# Patient Record
Sex: Female | Born: 2000 | Race: Black or African American | Hispanic: No | Marital: Single | Smoking: Current every day smoker
Health system: Southern US, Community
[De-identification: ages and names within clinical notes are randomized; demographics above are authoritative.]

## PROBLEM LIST (undated history)

## (undated) ENCOUNTER — Ambulatory Visit: Admission: EM | Source: Home / Self Care

## (undated) DIAGNOSIS — M419 Scoliosis, unspecified: Secondary | ICD-10-CM

## (undated) HISTORY — DX: Scoliosis, unspecified: M41.9

---
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
## 2018-05-12 ENCOUNTER — Encounter: Payer: Self-pay | Admitting: Obstetrics & Gynecology

## 2018-05-12 ENCOUNTER — Ambulatory Visit (INDEPENDENT_AMBULATORY_CARE_PROVIDER_SITE_OTHER): Payer: Medicaid Other | Admitting: Obstetrics & Gynecology

## 2018-05-12 VITALS — BP 100/66 | HR 101 | Wt 143.5 lb

## 2018-05-12 DIAGNOSIS — Z30011 Encounter for initial prescription of contraceptive pills: Secondary | ICD-10-CM

## 2018-05-12 DIAGNOSIS — R102 Pelvic and perineal pain: Secondary | ICD-10-CM | POA: Diagnosis not present

## 2018-05-12 DIAGNOSIS — Z3202 Encounter for pregnancy test, result negative: Secondary | ICD-10-CM | POA: Diagnosis not present

## 2018-05-12 DIAGNOSIS — N390 Urinary tract infection, site not specified: Secondary | ICD-10-CM

## 2018-05-12 DIAGNOSIS — R300 Dysuria: Secondary | ICD-10-CM | POA: Diagnosis not present

## 2018-05-12 DIAGNOSIS — Z7251 High risk heterosexual behavior: Secondary | ICD-10-CM | POA: Diagnosis not present

## 2018-05-12 LAB — POCT URINE PREGNANCY: Preg Test, Ur: NEGATIVE

## 2018-05-12 MED ORDER — NORGESTREL-ETHINYL ESTRADIOL 0.3-30 MG-MCG PO TABS: 1.0000 | ORAL_TABLET | Freq: Every day | ORAL | 11 refills | Status: DC

## 2018-05-12 MED ORDER — CEFTRIAXONE SODIUM 250 MG IJ SOLR
250.0000 mg | Freq: Once | INTRAMUSCULAR | Status: AC
  Administered 2018-05-12: 250 mg via INTRAMUSCULAR

## 2018-05-12 NOTE — Progress Notes (Signed)
Pt presents to f/u Ureaplasma Urealyticum. Referred by [facility]. Pt c/o outer vulva irritation. Pt wants to discuss birth control.

## 2018-05-12 NOTE — Progress Notes (Signed)
Patient ID: Mallory Mcintyre, female   DOB: 06-05-2001, 17 y.o.   MRN: [id number]  Chief Complaint  Patient presents with  . Follow-up    HPI Mallory Mcintyre is a 17 y.o. female.  Single P0 here today with the issue of 8-9 month of cramping in her vagina. This comes and goes. She reports that she has tried IBU "more than once" and this did not help. Walking makes it worse. She gets menstrual cramps with her periods, but this pain is different. She reports that only once did this pain happen with sex.   She was prescribed doxy for a UTI of ureaplasma but she would throw it up.   She reports that she has some peeling skin on her vulva, present for a few months (maybe 7).  Nothing makes it better or worse. She tried wiping with a hot rag. She has not tried any creams/treatments.  Past Medical History:  Diagnosis Date  . Scoliosis     Past Surgical History:  Procedure Laterality Date  . SPINAL FUSION  08/12/2013    Family History  Problem Relation Age of Onset  . Diabetes Other   . Asthma Other   . Hypertension Other     Social History Social History   Tobacco Use  . Smoking status: Heavy Tobacco Smoker    Types: Cigars  . Smokeless tobacco: Never Used  . Tobacco comment: 2-3 Black n mild daily   Substance Use Topics  . Alcohol use: No  . Drug use: No    No Known Allergies  Current Outpatient Medications  Medication Sig Dispense Refill  . hydrocortisone 2.5 % lotion Use twice daily as needed for itching. Do not apply to face. (Patient not taking: Reported on 05/12/2018) 59 mL 0  . ranitidine (ZANTAC) 150 MG capsule Take 1 capsule (150 mg total) by mouth daily. (Patient not taking: Reported on 05/12/2018) 30 capsule 0   No current facility-administered medications for this visit.     Review of Systems Review of Systems She uses condoms "sometimes, but very rarely", says that she does not want a pregnancy. She has been monogamous since 1/19 She had STI testing at her other  doctor's office- negative  Blood pressure 100/66, pulse (!) 101, weight 143 lb 8 oz (65.1 kg), last menstrual period 05/06/2018.  Physical Exam Physical Exam Breathing, conversing, and ambulating normally Well nourished, well hydrated Black female, no apparent distress Her vulva and vaginal discharge are completely normal She has point tenderness when I press up against her urethra  Data Reviewed I reviewed her records from [facility] Her hbg is 11. She does think that she would remember to take a OCP daily She works at [employer], parttime Assessment    Contraception Vaginal pain Vulvar skin issues    Plan    Start lo ovral with NMP, rec condoms all the time    She will ask her mom later if she has had Gardasil. If not, then she will start the series when she comes back.   She will return when/if her vulvar skin condition returns.  I will give her a shot of rocephin today. If her vaginal pain (urethral pain) does not abate, then I will refer her to a urologist.  She declines a flu vaccine.  Come back 6 weeks  Mallory Mcintyre 05/12/2018, 2:02 PM

## 2018-05-14 LAB — URINE CULTURE

## 2018-06-23 ENCOUNTER — Ambulatory Visit (INDEPENDENT_AMBULATORY_CARE_PROVIDER_SITE_OTHER): Payer: Medicaid Other | Admitting: Obstetrics and Gynecology

## 2018-06-23 ENCOUNTER — Encounter: Payer: Self-pay | Admitting: Obstetrics and Gynecology

## 2018-06-23 ENCOUNTER — Other Ambulatory Visit (HOSPITAL_COMMUNITY)
Admission: RE | Admit: 2018-06-23 | Discharge: 2018-06-23 | Disposition: A | Discharge status: Home / Self Care | Payer: Medicaid Other | Source: Ambulatory Visit | Attending: Obstetrics and Gynecology | Admitting: Obstetrics and Gynecology

## 2018-06-23 VITALS — BP 135/84 | Wt 144.8 lb

## 2018-06-23 DIAGNOSIS — N761 Subacute and chronic vaginitis: Secondary | ICD-10-CM

## 2018-06-23 MED ORDER — NYSTATIN 100000 UNIT/GM EX CREA: 1.0000 "application " | TOPICAL_CREAM | Freq: Two times a day (BID) | CUTANEOUS | 1 refills | Status: DC

## 2018-06-23 NOTE — Progress Notes (Signed)
17 yo here for follow up on vaginal pain. Patient reports pain unchanged from previously and her skin continues to peel. She is taking COC for contraception. She reports some dysmenorrhea with her last period. She reports intermittent vaginal irritation. She reports intermittent urinary frequency. She is sexually active without complaints.  Past Medical History:  Diagnosis Date  . Scoliosis    Past Surgical History:  Procedure Laterality Date  . SPINAL FUSION  08/12/2013   Family History  Problem Relation Age of Onset  . Diabetes Other   . Asthma Other   . Hypertension Other    Social History   Tobacco Use  . Smoking status: Heavy Tobacco Smoker    Types: Cigars  . Smokeless tobacco: Never Used  . Tobacco comment: 2-3 Black n mild daily   Substance Use Topics  . Alcohol use: No  . Drug use: No   ROS See pertinent in HPI  Blood pressure 135/84, weight 144 lb 12.8 oz (65.7 kg), last menstrual period 06/05/2018.  GENERAL: Well-developed, well-nourished female in no acute distress.  ABDOMEN: Soft, nontender, nondistended. No organomegaly. PELVIC: Normal external female genitalia. Vagina is pink and rugated.  Normal discharge. Normal appearing cervix. Uterus is normal in size. No adnexal mass or tenderness. EXTREMITIES: No cyanosis, clubbing, or edema, 2+ distal pulses.  A/P 17 yo with vaginal pain - wet prep collected - urine culture collected - rx nystatin cream provided - patient will be contacted with abnormal results

## 2018-06-23 NOTE — Progress Notes (Signed)
Pt was here in 10/19 for vulvar pain/"peeling". Pt also had ureaplasma, was given rocephin injection- pt was also given birth control pills at that visit. Pt states she is still having the vulvar pain and also is still having to urinate quite often.

## 2018-06-24 LAB — CERVICOVAGINAL ANCILLARY ONLY
Bacterial vaginitis: NEGATIVE
CANDIDA VAGINITIS: POSITIVE — AB
Chlamydia: NEGATIVE
Neisseria Gonorrhea: NEGATIVE
TRICH (WINDOWPATH): NEGATIVE

## 2018-06-25 LAB — URINE CULTURE

## 2018-06-25 MED ORDER — FLUCONAZOLE 150 MG PO TABS: 150.0000 mg | ORAL_TABLET | Freq: Once | ORAL | 0 refills | Status: AC

## 2018-06-25 NOTE — Addendum Note (Signed)
Addended by: Catalina AntiguaONSTANT, Minnah Llamas on: 06/25/2018 08:04 AM   Modules accepted: Orders

## 2018-08-12 NOTE — L&D Delivery Note (Signed)
Patient is a 18 y.o. now G1P1 s/p NSVD at [redacted]w[redacted]d, who was admitted for PROM with SOL less than 24 hours prior to delivery.  She progressed without augmentation to complete and pushed 14 minutes to deliver.  Cord clamping delayed by several minutes then clamped by CNM and cut by FOB- Janique.  Placenta intact and spontaneous, bleeding minimal.  Right labial laceration repaired without difficulty.  Mom and baby stable prior to transfer to postpartum. She plans on breastfeeding. She is unsure of method for birth control.  Delivery Note At 8:39 AM a viable and healthy female was delivered via Vaginal, Spontaneous (Presentation: LOA).  APGAR: 9, 9; weight pending .   Placenta intact and spontaneous, bleeding minimal.  3VCord with no labor or delivery complications.  Anesthesia: IV Fentanyl   Episiotomy: None Lacerations: Labial Suture Repair: 3.0 vicryl Est. Blood Loss (mL): 141  Mom to postpartum.  Baby to Couplet care / Skin to Skin.  Lajean Manes CNM 05/12/2019, 9:20 AM

## 2018-09-24 ENCOUNTER — Encounter: Payer: Self-pay | Admitting: Obstetrics

## 2018-10-20 ENCOUNTER — Other Ambulatory Visit (HOSPITAL_COMMUNITY)
Admission: RE | Admit: 2018-10-20 | Discharge: 2018-10-20 | Disposition: A | Discharge status: Home / Self Care | Payer: Medicaid Other | Source: Ambulatory Visit

## 2018-10-20 ENCOUNTER — Ambulatory Visit (INDEPENDENT_AMBULATORY_CARE_PROVIDER_SITE_OTHER): Payer: Medicaid Other

## 2018-10-20 VITALS — BP 125/77 | HR 108 | Temp 98.6°F | Wt 129.2 lb

## 2018-10-20 DIAGNOSIS — Z3401 Encounter for supervision of normal first pregnancy, first trimester: Secondary | ICD-10-CM

## 2018-10-20 DIAGNOSIS — Z3A11 11 weeks gestation of pregnancy: Secondary | ICD-10-CM

## 2018-10-20 DIAGNOSIS — O98811 Other maternal infectious and parasitic diseases complicating pregnancy, first trimester: Secondary | ICD-10-CM

## 2018-10-20 DIAGNOSIS — Z3400 Encounter for supervision of normal first pregnancy, unspecified trimester: Secondary | ICD-10-CM | POA: Diagnosis not present

## 2018-10-20 DIAGNOSIS — O98819 Other maternal infectious and parasitic diseases complicating pregnancy, unspecified trimester: Secondary | ICD-10-CM

## 2018-10-20 DIAGNOSIS — F1290 Cannabis use, unspecified, uncomplicated: Secondary | ICD-10-CM

## 2018-10-20 DIAGNOSIS — Z3481 Encounter for supervision of other normal pregnancy, first trimester: Secondary | ICD-10-CM | POA: Diagnosis not present

## 2018-10-20 DIAGNOSIS — Z3490 Encounter for supervision of normal pregnancy, unspecified, unspecified trimester: Secondary | ICD-10-CM

## 2018-10-20 DIAGNOSIS — B373 Candidiasis of vulva and vagina: Secondary | ICD-10-CM

## 2018-10-20 HISTORY — DX: Encounter for supervision of normal pregnancy, unspecified, unspecified trimester: Z34.90

## 2018-10-20 MED ORDER — ONDANSETRON 4 MG PO TBDP: 4.0000 mg | ORAL_TABLET | Freq: Four times a day (QID) | ORAL | 0 refills | Status: DC | PRN

## 2018-10-20 NOTE — Patient Instructions (Addendum)
Prenatal Care Prenatal care is health care during pregnancy. It helps you and your unborn baby (fetus) stay as healthy as possible. Prenatal care may be provided by a midwife, a family practice health care provider, or a childbirth and pregnancy specialist (obstetrician). How does this affect me? During pregnancy, you will be closely monitored for any new conditions that might develop. To lower your risk of pregnancy complications, you and your health care provider will talk about any underlying conditions you have. How does this affect my baby? Early and consistent prenatal care increases the chance that your baby will be healthy during pregnancy. Prenatal care lowers the risk that your baby will be:  Born early (prematurely).  Smaller than expected at birth (small for gestational age). What can I expect at the first prenatal care visit? Your first prenatal care visit will likely be the longest. You should schedule your first prenatal care visit as soon as you know that you are pregnant. Your first visit is a good time to talk about any questions or concerns you have about pregnancy. At your visit, you and your health care provider will talk about:  Your medical history, including: ? Any past pregnancies. ? Your family's medical history. ? The baby's father's medical history. ? Any long-term (chronic) health conditions you have and how you manage them. ? Any surgeries or procedures you have had. ? Any current over-the-counter or prescription medicines, herbs, or supplements you are taking.  Other factors that could pose a risk to your baby, including:  Your home setting and your stress levels, including: ? Exposure to abuse or violence. ? Household financial strain. ? Mental health conditions you have.  Your daily health habits, including diet and exercise. Your health care provider will also:  Measure your weight, height, and blood pressure.  Do a physical exam, including a pelvic  and breast exam.  Perform blood tests and urine tests to check for: ? Urinary tract infection. ? Sexually transmitted infections (STIs). ? Low iron levels in your blood (anemia). ? Blood type and certain proteins on red blood cells (Rh antibodies). ? Infections and immunity to viruses, such as hepatitis B and rubella. ? HIV (human immunodeficiency virus).  Do an ultrasound to confirm your baby's growth and development and to help predict your estimated due date (EDD). This ultrasound is done with a probe that is inserted into the vagina (transvaginal ultrasound).  Discuss your options for genetic screening.  Give you information about how to keep yourself and your baby healthy, including: ? Nutrition and taking vitamins. ? Physical activity. ? How to manage pregnancy symptoms such as nausea and vomiting (morning sickness). ? Infections and substances that may be harmful to your baby and how to avoid them. ? Food safety. ? Dental care. ? Working. ? Travel. ? Warning signs to watch for and when to call your health care provider. How often will I have prenatal care visits? After your first prenatal care visit, you will have regular visits throughout your pregnancy. The visit schedule is often as follows:  Up to week 28 of pregnancy: once every 4 weeks.  28-36 weeks: once every 2 weeks.  After 36 weeks: every week until delivery. Some women may have visits more or less often depending on any underlying health conditions and the health of the baby. Keep all follow-up and prenatal care visits as told by your health care provider. This is important. What happens during routine prenatal care visits? Your health care provider will:    Measure your weight and blood pressure.  Check for fetal heart sounds.  Measure the height of your uterus in your abdomen (fundal height). This may be measured starting around week 20 of pregnancy.  Check the position of your baby inside your  uterus.  Ask questions about your diet, sleeping patterns, and whether you can feel the baby move.  Review warning signs to watch for and signs of labor.  Ask about any pregnancy symptoms you are having and how you are dealing with them. Symptoms may include: ? Headaches. ? Nausea and vomiting. ? Vaginal discharge. ? Swelling. ? Fatigue. ? Constipation. ? Any discomfort, including back or pelvic pain. Make a list of questions to ask your health care provider at your routine visits. What tests might I have during prenatal care visits? You may have blood, urine, and imaging tests throughout your pregnancy, such as:  Urine tests to check for glucose, protein, or signs of infection.  Glucose tests to check for a form of diabetes that can develop during pregnancy (gestational diabetes mellitus). This is usually done around week 24 of pregnancy.  An ultrasound to check your baby's growth and development and to check for birth defects. This is usually done around week 20 of pregnancy.  A test to check for group B strep (GBS) infection. This is usually done around week 36 of pregnancy.  Genetic testing. This may include blood or imaging tests, such as an ultrasound. Some genetic tests are done during the first trimester and some are done during the second trimester. What else can I expect during prenatal care visits? Your health care provider may recommend getting certain vaccines during pregnancy. These may include:  A yearly flu shot (annual influenza vaccine). This is especially important if you will be pregnant during flu season.  Tdap (tetanus, diphtheria, pertussis) vaccine. Getting this vaccine during pregnancy can protect your baby from whooping cough (pertussis) after birth. This vaccine may be recommended between weeks 27 and 36 of pregnancy. Later in your pregnancy, your health care provider may give you information about:  Childbirth and breastfeeding classes.  Choosing a  health care provider for your baby. U Pregnancy and Anemia  Anemia is a condition in which the concentration of red blood cells, or hemoglobin, in the blood is below normal. Hemoglobin is a substance in red blood cells that carries oxygen to the tissues of the body. Anemia results when enough oxygen does not reach these tissues. Anemia is common during pregnancy because the woman's body needs more blood volume and blood cells to provide nutrition to the fetus. The fetus needs iron and folic acid as it is developing. Your body may not produce enough red blood cells because of this. Also, during pregnancy, the liquid part of the blood (plasma) increases by about 30-50%, and the red blood cells increase by only 20%. This lowers the concentration of the red blood cells and creates a natural anemia-like situation. What are the causes? The most common cause of anemia during pregnancy is not having enough iron in the body to make red blood cells (iron deficiency anemia). Other causes may include: Folic acid deficiency. Vitamin B12 deficiency. Certain prescription or over-the-counter medicines. Certain medical conditions or infections that destroy red blood cells. A low platelet count and bleeding caused by antibodies that go through the placenta to the fetus from the mother's blood. What are the signs or symptoms? Mild anemia may not be noticeable. If it becomes severe, symptoms may include: Feeling tired (  fatigue). Shortness of breath, especially during activity. Weakness. Fainting. Pale looking skin. Headaches. A fast or irregular heartbeat (palpitations). Dizziness. How is this diagnosed? This condition may be diagnosed based on: Your medical history and a physical exam. Blood tests. How is this treated? Treatment for anemia during pregnancy depends on the cause of the anemia. Treatment can include: Dietary changes. Supplements of iron, vitamin B12, or folic acid. A blood transfusion. This  may be needed if anemia is severe. Hospitalization. This may be needed if there is a lot of blood loss or severe anemia. Follow these instructions at home: Follow recommendations from your dietitian or health care provider about changing your diet. Increase your vitamin C intake. This will help the stomach absorb more iron. Some foods that are high in vitamin C include: Oranges. Peppers. Tomatoes. Mangoes. Eat a diet rich in iron. This would include foods such as: Liver. Beef. Eggs. Whole grains. Spinach. Dried fruit. Take iron and vitamins as told by your health care provider. Eat green leafy vegetables. These are a good source of folic acid. Keep all follow-up visits as told by your health care provider. This is important. Contact a health care provider if: You have frequent or lasting headaches. You look pale. You bruise easily. Get help right away if: You have extreme weakness, shortness of breath, or chest pain. You become dizzy or have trouble concentrating. You have heavy vaginal bleeding. You develop a rash. You have bloody or black, tarry stools. You faint. You vomit up blood. You vomit repeatedly. You have abdominal pain. You have a fever. You are dehydrated. Summary Anemia is a condition in which the concentration of red blood cells or hemoglobin in the blood is below normal. Anemia is common during pregnancy because the woman's body needs more blood volume and blood cells to provide nutrition to the fetus. The most common cause of anemia during pregnancy is not having enough iron in the body to make red blood cells (iron deficiency anemia). Mild anemia may not be noticeable. If it becomes severe, symptoms may include feeling tired and weak. This information is not intended to replace advice given to you by your health care provider. Make sure you discuss any questions you have with your health care provider. Document Released: 07/26/2000 Document Revised:  09/03/2016 Document Reviewed: 09/03/2016 Elsevier Interactive Patient Education  2019 [employer].  [place] cord banking.  Breastfeeding.  Birth control after your baby is born.  The hospital labor and delivery unit and how to tour it.  Registering at the hospital before you go into labor. Where to find more information  Office on Women's Health: TravelLesson.ca  American Pregnancy Association: americanpregnancy.org  March of Dimes: marchofdimes.org Summary  Prenatal care helps you and your baby stay as healthy as possible during pregnancy.  Your first prenatal care visit will most likely be the longest.  You will have visits and tests throughout your pregnancy to monitor your health and your baby's health.  Bring a list of questions to your visits to ask your health care provider.  Make sure to keep all follow-up and prenatal care visits with your health care provider. This information is not intended to replace advice given to you by your health care provider. Make sure you discuss any questions you have with your health care provider. Document Released: 08/01/2003 Document Revised: 07/28/2017 Document Reviewed: 07/28/2017 Elsevier Interactive Patient Education  2019 [employer].

## 2018-10-20 NOTE — Progress Notes (Signed)
Subjective:   Mallory Mcintyre is a 17 y.o. G1P0000 at [redacted]w[redacted]d by [employer] being seen today for her first obstetrical visit.  Her obstetrical history is significant for smoker.  Patient states she has quit smoking cigars, but continues to smoke Marijuana.  She states the marijuana assists with the morning sickness and expresses a desire to quit, but feels it is necessary to eat. Patient reports a history of scoliosis. Patient does intend to breast feed. Pregnancy history fully reviewed.  Patient reports nausea.  HISTORY: OB History  Gravida Para Term Preterm AB Living  1 0 0 0 0 0  SAB TAB Ectopic Multiple Live Births  0 0 0 0 0    # Outcome Date GA Lbr Len/2nd Weight Sex Delivery Anes PTL Lv  1 Current             Last pap smear was deferred due to age  Past Medical History:  Diagnosis Date  . Scoliosis    Past Surgical History:  Procedure Laterality Date  . SPINAL FUSION  08/12/2013   Family History  Problem Relation Age of Onset  . Diabetes Other   . Asthma Other   . Hypertension Other    Social History   Tobacco Use  . Smoking status: Heavy Tobacco Smoker    Types: Cigars  . Smokeless tobacco: Never Used  . Tobacco comment: 2-3 Black n mild daily   Substance Use Topics  . Alcohol use: No  . Drug use: No   No Known Allergies Current Outpatient Medications on File Prior to Visit  Medication Sig Dispense Refill  . Prenatal MV-Min-FA-Omega-3 (PRENATAL GUMMIES/DHA & FA) 0.4-32.5 MG CHEW Chew by mouth.     No current facility-administered medications on file prior to visit.     Review of Systems Pertinent items noted in HPI and remainder of comprehensive ROS otherwise negative.  Exam   Vitals:   10/20/18 1427  BP: 125/77  Pulse: (!) 108  Temp: 98.6 F (37 C)  Weight: 129 lb 3.2 oz (58.6 kg)   Fetal Heart Rate (bpm): 174   Physical Exam  Constitutional: She is oriented to person, place, and time and well-developed, well-nourished, and in no  distress.  HENT:  Head: Normocephalic and atraumatic.  Eyes: Conjunctivae are normal.  Neck: Normal range of motion.  Cardiovascular: Normal rate, regular rhythm and normal heart sounds.  Pulmonary/Chest: Effort normal and breath sounds normal.  Abdominal: Soft. Bowel sounds are normal. There is no abdominal tenderness.  Musculoskeletal: Normal range of motion.        General: No edema.  Neurological: She is alert and oriented to person, place, and time.  Skin: Skin is warm and dry.  Psychiatric: Affect and judgment normal.    Assessment:   Pregnancy: G1P0000 Patient Active Problem List   Diagnosis Date Noted  . Encounter for supervision of normal pregnancy, antepartum 10/20/2018  . Scoliosis 11/26/2013     Plan:  1. Supervision of normal first pregnancy, antepartum -Discussed discontinuation of marijuana. -Rx for Zofran 4mg  ODT sent to pharmacy on file. *Instructed on proper administration and weaning of marijuana. -Anticipatory guidance for [facility] including appointments and testing. -Discussed option of baby scripts, Centering Pregnancy, and Routine PNC -Desires routine PNC -RTO in 4 weeks for regular PNV  *Culture, OB Urine *Obstetric Panel, Including HIV *Cervicovaginal ancillary only( [place]) *Genetic Screening   Initial labs drawn. Continue prenatal vitamins. Genetic Screening discussed, First trimester screen, Quad screen and NIPS: ordered.  Ultrasound discussed; fetal anatomic survey: Discussed Problem list reviewed and updated. The nature of [facility] with multiple MDs and other Advanced Practice Providers was explained to patient; also emphasized that residents, students are part of our team. Routine obstetric precautions reviewed. Return in about 4 weeks (around 11/17/2018) for ROB.    Cherre Robins, CNM 10/20/2018 6:30 PM

## 2018-10-21 DIAGNOSIS — F1290 Cannabis use, unspecified, uncomplicated: Secondary | ICD-10-CM | POA: Insufficient documentation

## 2018-10-21 LAB — OBSTETRIC PANEL, INCLUDING HIV
Antibody Screen: NEGATIVE
BASOS ABS: 0 10*3/uL (ref 0.0–0.2)
Basos: 0 %
EOS (ABSOLUTE): 0 10*3/uL (ref 0.0–0.4)
Eos: 0 %
HIV Screen 4th Generation wRfx: NONREACTIVE
Hematocrit: 33.9 % — ABNORMAL LOW (ref 34.0–46.6)
Hemoglobin: 11.3 g/dL (ref 11.1–15.9)
Hepatitis B Surface Ag: NEGATIVE
Immature Grans (Abs): 0.1 10*3/uL (ref 0.0–0.1)
Immature Granulocytes: 1 %
Lymphocytes Absolute: 2 10*3/uL (ref 0.7–3.1)
Lymphs: 22 %
MCH: 28.7 pg (ref 26.6–33.0)
MCHC: 33.3 g/dL (ref 31.5–35.7)
MCV: 86 fL (ref 79–97)
Monocytes Absolute: 0.7 10*3/uL (ref 0.1–0.9)
Monocytes: 7 %
NEUTROS ABS: 6.4 10*3/uL (ref 1.4–7.0)
Neutrophils: 70 %
PLATELETS: 355 10*3/uL (ref 150–450)
RBC: 3.94 x10E6/uL (ref 3.77–5.28)
RDW: 11.6 % — AB (ref 11.7–15.4)
RPR: NONREACTIVE
Rh Factor: POSITIVE
Rubella Antibodies, IGG: 2.55 index (ref >0.99)
WBC: 9.1 10*3/uL (ref 3.4–10.8)

## 2018-10-21 LAB — CERVICOVAGINAL ANCILLARY ONLY
Bacterial vaginitis: NEGATIVE
CANDIDA VAGINITIS: POSITIVE — AB
Chlamydia: NEGATIVE
Neisseria Gonorrhea: NEGATIVE
Trichomonas: NEGATIVE

## 2018-10-22 LAB — URINE CULTURE, OB REFLEX

## 2018-10-22 LAB — CULTURE, OB URINE

## 2018-10-22 MED ORDER — TERCONAZOLE 0.4 % VA CREA: 1.0000 | TOPICAL_CREAM | Freq: Every day | VAGINAL | 0 refills | Status: DC

## 2018-10-22 NOTE — Addendum Note (Signed)
Addended by: Gerrit Heck L on: 10/22/2018 05:22 PM   Modules accepted: Orders

## 2018-11-03 ENCOUNTER — Telehealth: Payer: Self-pay

## 2018-11-03 DIAGNOSIS — D563 Thalassemia minor: Secondary | ICD-10-CM | POA: Insufficient documentation

## 2018-11-03 DIAGNOSIS — Z3400 Encounter for supervision of normal first pregnancy, unspecified trimester: Secondary | ICD-10-CM

## 2018-11-03 MED ORDER — ONDANSETRON 4 MG PO TBDP: 4.0000 mg | ORAL_TABLET | Freq: Four times a day (QID) | ORAL | 0 refills | Status: DC | PRN

## 2018-11-03 NOTE — Telephone Encounter (Addendum)
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  Patient educated on alpha thalassemia, how it may manifest, and Horizon recommendation for partner screening.  Patient questions how partner is to be screened and instructed that partner could contact PCP.  Also informed that provider would find out if [employer] would provide a screening kit for partner.  Reassurances given that even if partner is a carrier, the risk of having a baby with the disorder is low to none per [employer].  Patient without further questions or concerns regarding results.  Patient does question if she can bring partner to upcoming visit and provider informed that visitors need to be limited in setting of Co-Vid 19 pandemic.  Provider also gave patient option for Telehealth visit and explained what this would consist of.  Informed that office staff would contact patient closer to visit with options and further information.  Patient questions if Rx for yeast infection was sent to pharmacy and provider confirms.  Patient also requests refill on Zofran which provider will send in for 53m ODT Q6hrs prn, Disp 30, RF0.  Patient questions when [place] be performed and educated accordingly.  No other questions or concerns.  Encouraged to call or report to MAU for any issues.  MFM Genetics Referral Placed  I provided ~15 minutes of non-face-to-face time during this encounter.   JMaryann ConnersMSN, CNM  11/03/2018 2:20 PM

## 2018-11-18 ENCOUNTER — Ambulatory Visit (HOSPITAL_COMMUNITY): Payer: Medicaid Other

## 2018-11-19 ENCOUNTER — Other Ambulatory Visit: Payer: Self-pay

## 2018-11-19 ENCOUNTER — Encounter: Payer: Self-pay | Admitting: Certified Nurse Midwife

## 2018-11-19 ENCOUNTER — Ambulatory Visit (INDEPENDENT_AMBULATORY_CARE_PROVIDER_SITE_OTHER): Payer: Medicaid Other | Admitting: Certified Nurse Midwife

## 2018-11-19 DIAGNOSIS — F1290 Cannabis use, unspecified, uncomplicated: Secondary | ICD-10-CM

## 2018-11-19 DIAGNOSIS — D563 Thalassemia minor: Secondary | ICD-10-CM

## 2018-11-19 DIAGNOSIS — Z3402 Encounter for supervision of normal first pregnancy, second trimester: Secondary | ICD-10-CM

## 2018-11-19 DIAGNOSIS — B3731 Acute candidiasis of vulva and vagina: Secondary | ICD-10-CM

## 2018-11-19 DIAGNOSIS — Z3400 Encounter for supervision of normal first pregnancy, unspecified trimester: Secondary | ICD-10-CM

## 2018-11-19 DIAGNOSIS — Z3A15 15 weeks gestation of pregnancy: Secondary | ICD-10-CM | POA: Diagnosis not present

## 2018-11-19 DIAGNOSIS — B373 Candidiasis of vulva and vagina: Secondary | ICD-10-CM

## 2018-11-19 MED ORDER — BLOOD PRESSURE MONITORING KIT: 1.0000 | PACK | Freq: Once | 0 refills | Status: AC

## 2018-11-19 MED ORDER — TERCONAZOLE 0.8 % VA CREA: 1.0000 | TOPICAL_CREAM | Freq: Every day | VAGINAL | 0 refills | Status: DC

## 2018-11-19 NOTE — Progress Notes (Signed)
Pt states she is having dysuria no blood, pt notes recent intercourse pt notes she drinks plenty of fluids.   Pt never received Terazol Rx states pharmacy told her it was not sent.

## 2018-11-19 NOTE — Progress Notes (Signed)
TELEHEALTH VIRTUAL OBSTETRICS VISIT ENCOUNTER NOTE  I connected with Mallory Mcintyre on 11/19/18 at 11:15 AM EDT by telephone at home and verified that I am speaking with the correct person using two identifiers.   I discussed the limitations, risks, security and privacy concerns of performing an evaluation and management service by telephone and the availability of in person appointments. I also discussed with the patient that there may be a patient responsible charge related to this service. The patient expressed understanding and agreed to proceed.  Subjective:  Mallory Mcintyre is a 18 y.o. G1P0000 at 5230w5d being followed for ongoing prenatal care.  She is currently monitored for the following issues for this low-risk pregnancy and has Scoliosis; Encounter for supervision of normal pregnancy, antepartum; Marijuana use; and Alpha thalassemia silent carrier on their problem list.  Patient reports dysuria. Reports fetal movement. Denies any contractions, bleeding or leaking of fluid.   The following portions of the patient's history were reviewed and updated as appropriate: allergies, current medications, past family history, past medical history, past social history, past surgical history and problem list.   Objective:   General:  Alert, oriented and cooperative.   Mental Status: Normal mood and affect perceived. Normal judgment and thought content.  Rest of physical exam deferred due to type of encounter  Assessment and Plan:  Pregnancy: G1P0000 at 6830w5d 1. Supervision of normal first pregnancy, antepartum - Routine prenatal care  - Anticipatory guidance on upcoming appointments with telehealth due to social distancing  - COVID19 precautions discussed  - BP cuff Rx sent to pharmacy of choice for enrollment of babyscripts  - Patient reports pressure after urination, denies burning, odor or vaginal discharge  - Educated and discussed physiologic changes during pregnancy, encouraged to continue  to consume 8-10 bottles of water per day as she currently is, discussed warning signs and reasons to be evaluated, patient verbalizes understanding  - US MFM OB DETAIL +14 WK; Future - Babyscripts Schedule Optimization  2. Alpha thalassemia silent carrier - Genetic counseling planned on 11/27/18 - US MFM OB DETAIL +14 WK; Future - AMB MFM GENETICS REFERRAL  3. Marijuana use - Reports continued use of marijuana, reports wanting to stop - Educated on continuing to cut down use from daily to every other day and so on. Giving self a goal stop date.  - Discussed marijuana use during pregnancy and risks to fetus with smoking, patient verbalizes understanding   4. Vulvovaginal candidiasis - Reports never picking up medication for yeast infection due to pharmacy not having order, medication Rx resent  - terconazole (TERAZOL 3) 0.8 % vaginal cream; Place 1 applicator vaginally at bedtime.  Dispense: 20 g; Refill: 0  Preterm labor symptoms and general obstetric precautions including but not limited to vaginal bleeding, contractions, leaking of fluid and fetal movement were reviewed in detail with the patient.  I discussed the assessment and treatment plan with the patient. The patient was provided an opportunity to ask questions and all were answered. The patient agreed with the plan and demonstrated an understanding of the instructions. The patient was advised to call back or seek an in-person office evaluation/[facility] for any urgent or concerning symptoms. Please refer to After Visit Summary for other counseling recommendations.   I provided 11 minutes of non-face-to-face time during this encounter.  Return in about 4 weeks (around 12/17/2018) for ROB-TELEHEALTH.  Future Appointments  Date Time Provider Department Center  11/27/2018 10:00 AM [facility] GENETIC  COUNSELING [facility]  12/17/2018  2:45 PM Sharyon Cable, CNM [facility] None    Sharyon Cable, CNM  Center for [employer], [facility]

## 2018-11-27 ENCOUNTER — Ambulatory Visit (HOSPITAL_COMMUNITY): Payer: Self-pay | Admitting: Certified Nurse Midwife

## 2018-11-27 ENCOUNTER — Ambulatory Visit (HOSPITAL_COMMUNITY): Payer: Medicaid Other

## 2018-11-27 ENCOUNTER — Other Ambulatory Visit: Payer: Self-pay

## 2018-11-27 ENCOUNTER — Ambulatory Visit (HOSPITAL_COMMUNITY): Payer: Medicaid Other | Attending: Obstetrics and Gynecology

## 2018-11-27 DIAGNOSIS — Z3480 Encounter for supervision of other normal pregnancy, unspecified trimester: Secondary | ICD-10-CM

## 2018-12-02 LAB — AFP, SERUM, OPEN SPINA BIFIDA
AFP MoM: 1.16
AFP Value: 51.9 ng/mL
Gest. Age on Collection Date: 16.9 weeks
Maternal Age At EDD: 19.8 yr
OSBR Risk 1 IN: 10000
Test Results:: NEGATIVE
Weight: 129 [lb_av]

## 2018-12-10 ENCOUNTER — Ambulatory Visit (HOSPITAL_COMMUNITY): Payer: Medicaid Other | Admitting: *Deleted

## 2018-12-10 ENCOUNTER — Other Ambulatory Visit: Payer: Self-pay

## 2018-12-10 ENCOUNTER — Encounter (HOSPITAL_COMMUNITY): Payer: Self-pay

## 2018-12-10 ENCOUNTER — Ambulatory Visit (HOSPITAL_COMMUNITY)
Admission: RE | Admit: 2018-12-10 | Discharge: 2018-12-10 | Disposition: A | Discharge status: Home / Self Care | Payer: Medicaid Other | Source: Ambulatory Visit | Attending: Obstetrics and Gynecology | Admitting: Obstetrics and Gynecology

## 2018-12-10 VITALS — Temp 98.6°F

## 2018-12-10 DIAGNOSIS — O0990 Supervision of high risk pregnancy, unspecified, unspecified trimester: Secondary | ICD-10-CM | POA: Diagnosis present

## 2018-12-10 DIAGNOSIS — O99012 Anemia complicating pregnancy, second trimester: Secondary | ICD-10-CM

## 2018-12-10 DIAGNOSIS — Z3A18 18 weeks gestation of pregnancy: Secondary | ICD-10-CM

## 2018-12-10 DIAGNOSIS — Z363 Encounter for antenatal screening for malformations: Secondary | ICD-10-CM

## 2018-12-10 DIAGNOSIS — Z3400 Encounter for supervision of normal first pregnancy, unspecified trimester: Secondary | ICD-10-CM | POA: Insufficient documentation

## 2018-12-10 DIAGNOSIS — D563 Thalassemia minor: Secondary | ICD-10-CM

## 2018-12-11 ENCOUNTER — Other Ambulatory Visit (HOSPITAL_COMMUNITY): Payer: Self-pay | Admitting: *Deleted

## 2018-12-11 DIAGNOSIS — Z362 Encounter for other antenatal screening follow-up: Secondary | ICD-10-CM

## 2018-12-17 ENCOUNTER — Encounter: Payer: Self-pay | Admitting: Certified Nurse Midwife

## 2018-12-17 ENCOUNTER — Ambulatory Visit (INDEPENDENT_AMBULATORY_CARE_PROVIDER_SITE_OTHER): Payer: Medicaid Other | Admitting: Certified Nurse Midwife

## 2018-12-17 ENCOUNTER — Other Ambulatory Visit: Payer: Self-pay

## 2018-12-17 DIAGNOSIS — Z3400 Encounter for supervision of normal first pregnancy, unspecified trimester: Secondary | ICD-10-CM

## 2018-12-17 DIAGNOSIS — Z3A19 19 weeks gestation of pregnancy: Secondary | ICD-10-CM

## 2018-12-17 DIAGNOSIS — F1290 Cannabis use, unspecified, uncomplicated: Secondary | ICD-10-CM

## 2018-12-17 DIAGNOSIS — O99322 Drug use complicating pregnancy, second trimester: Secondary | ICD-10-CM

## 2018-12-17 NOTE — Progress Notes (Signed)
TELEHEALTH VIRTUAL OBSTETRICS PRENATAL VISIT ENCOUNTER NOTE  I connected with Alinda Deem on 12/17/18 at  2:45 PM EDT by WebEx at home and verified that I am speaking with the correct person using two identifiers.   I discussed the limitations, risks, security and privacy concerns of performing an evaluation and management service by telephone and the availability of in person appointments. I also discussed with the patient that there may be a patient responsible charge related to this service. The patient expressed understanding and agreed to proceed. Subjective:  Mallory Mcintyre is a 18 y.o. G1P0000 at [redacted]w[redacted]d being seen today for ongoing prenatal care.  She is currently monitored for the following issues for this low-risk pregnancy and has Scoliosis; Encounter for supervision of normal pregnancy, antepartum; Marijuana use; and Alpha thalassemia silent carrier on their problem list.  Patient reports no complaints.  Reports fetal movement. Contractions: Not present. Vag. Bleeding: None.  Movement: Present. Denies any contractions, bleeding or leaking of fluid.   The following portions of the patient's history were reviewed and updated as appropriate: allergies, current medications, past family history, past medical history, past social history, past surgical history and problem list.   Objective:  There were no vitals filed for this visit.  Fetal Status:     Movement: Present     General:  Alert, oriented and cooperative. Patient is in no acute distress.  Respiratory: Normal respiratory effort, no problems with respiration noted  Mental Status: Normal mood and affect. Normal behavior. Normal judgment and thought content.  Rest of physical exam deferred due to type of encounter  Assessment and Plan:  Pregnancy: G1P0000 at [redacted]w[redacted]d 1. Supervision of normal first pregnancy, antepartum - Patient doing well, no complaints  - Routine prenatal care  - Anticipatory guidance on upcoming appointments  included next being webex appointment - Patient reports never receiving email for babyscripts, resent email subscription to verified email address, discussed if she does not receive email by tomorrow then needs to notify office so that we can give her BP cuff. Patient verbalizes understanding. Educated to check spam folder as well for notification.  - Reviewed anatomy US with patient, follow up scheduled   - Discussed pain control options with patient, she has hx of scoliosis and does not want to receive epidural during labor.   2. Marijuana use - Patient reports continued use of THC, reports cutting down since last conversation and plans to stop smoking completely on 5/9- gave herself goal to stop smoking by 20 weeks.   Preterm labor symptoms and general obstetric precautions including but not limited to vaginal bleeding, contractions, leaking of fluid and fetal movement were reviewed in detail with the patient. I discussed the assessment and treatment plan with the patient. The patient was provided an opportunity to ask questions and all were answered. The patient agreed with the plan and demonstrated an understanding of the instructions. The patient was advised to call back or seek an in-person office evaluation/go to MAU at [facility] for any urgent or concerning symptoms. Please refer to After Visit Summary for other counseling recommendations.   I provided 16 minutes of face-to-face via WebEx time during this encounter.  Return in about 4 weeks (around 01/14/2019) for ROB-webex.  Future Appointments  Date Time Provider Department Center  01/07/2019  1:00 PM [facility] NURSE [facility] M[facility]-US  01/07/2019  1:00 PM [facility] [place] 3 WH-MFCUS MFC-US    Sharyon Cable, CNM Center for [employer], [facility]

## 2018-12-17 NOTE — Progress Notes (Signed)
Subjective: Mallory Mcintyre is a G1P0000 at [redacted]w[redacted]d completed initial contraception counseling via telephone. She does not have a history of any mental health concerns. She is currently sexually active. She was previously using pills for birth control, however Mallory Mcintyre reports heavy bleeding and craps with birth control pills. Mallory Mcintyre reports an interest in birth control once she delivers. Patient states family as her support system.   LMP 08/01/2018   Birth Control History:  pills  MDM Patient counseled on all options for birth control today including LARC. Patient desires additional family planning counseling initiated for birth control.  Assessment:  18 y.o. female requesting additional family planning counseling.   Plan: No further plan   Mallory Mcintyre, [place] 12/17/2018 10:39 AM

## 2018-12-17 NOTE — Progress Notes (Signed)
*  Pt states she never received email Mallory Mcintyre

## 2018-12-21 ENCOUNTER — Other Ambulatory Visit: Payer: Self-pay | Admitting: Certified Nurse Midwife

## 2018-12-21 MED ORDER — BLOOD PRESSURE MONITOR KIT: 1.0000 | PACK | Freq: Every day | 0 refills | Status: DC

## 2019-01-07 ENCOUNTER — Encounter (HOSPITAL_COMMUNITY): Payer: Self-pay

## 2019-01-07 ENCOUNTER — Other Ambulatory Visit: Payer: Self-pay

## 2019-01-07 ENCOUNTER — Ambulatory Visit (HOSPITAL_COMMUNITY): Payer: Medicaid Other | Admitting: *Deleted

## 2019-01-07 ENCOUNTER — Ambulatory Visit (HOSPITAL_COMMUNITY)
Admission: RE | Admit: 2019-01-07 | Discharge: 2019-01-07 | Disposition: A | Discharge status: Home / Self Care | Payer: Medicaid Other | Source: Ambulatory Visit | Attending: Obstetrics and Gynecology | Admitting: Obstetrics and Gynecology

## 2019-01-07 VITALS — BP 100/61 | HR 61 | Temp 98.5°F | Wt 142.6 lb

## 2019-01-07 DIAGNOSIS — Z3A22 22 weeks gestation of pregnancy: Secondary | ICD-10-CM | POA: Diagnosis not present

## 2019-01-07 DIAGNOSIS — Z362 Encounter for other antenatal screening follow-up: Secondary | ICD-10-CM | POA: Insufficient documentation

## 2019-01-07 DIAGNOSIS — O99012 Anemia complicating pregnancy, second trimester: Secondary | ICD-10-CM

## 2019-01-07 DIAGNOSIS — O0990 Supervision of high risk pregnancy, unspecified, unspecified trimester: Secondary | ICD-10-CM

## 2019-01-14 ENCOUNTER — Encounter: Payer: Self-pay | Admitting: Certified Nurse Midwife

## 2019-01-14 ENCOUNTER — Ambulatory Visit (INDEPENDENT_AMBULATORY_CARE_PROVIDER_SITE_OTHER): Payer: Medicaid Other | Admitting: Certified Nurse Midwife

## 2019-01-14 DIAGNOSIS — Z3402 Encounter for supervision of normal first pregnancy, second trimester: Secondary | ICD-10-CM

## 2019-01-14 DIAGNOSIS — Z3400 Encounter for supervision of normal first pregnancy, unspecified trimester: Secondary | ICD-10-CM

## 2019-01-14 DIAGNOSIS — Z3A23 23 weeks gestation of pregnancy: Secondary | ICD-10-CM

## 2019-01-14 NOTE — Progress Notes (Signed)
Pt is on the phone preparing for virtual visit with provider. Pt states she has BP cuff but isn't at home right now to be able to check BP. Pt denies HA's or blurry vision.

## 2019-01-14 NOTE — Progress Notes (Signed)
TELEHEALTH OBSTETRICS PRENATAL VIRTUAL VIDEO VISIT ENCOUNTER NOTE  Provider location: Center for [facility] [place]   I connected with Mallory Mcintyre on 01/14/19 at  4:16 PM EDT by WebEx Encounter at home and verified that I am speaking with the correct person using two identifiers.   I discussed the limitations, risks, security and privacy concerns of performing an evaluation and management service by telephone and the availability of in person appointments. I also discussed with the patient that there may be a patient responsible charge related to this service. The patient expressed understanding and agreed to proceed. Subjective:  Mallory Mcintyre is a 18 y.o. G1P0000 at [redacted]w[redacted]d being seen today for ongoing prenatal care.  She is currently monitored for the following issues for this low-risk pregnancy and has Scoliosis; Encounter for supervision of normal pregnancy, antepartum; Marijuana use; and Alpha thalassemia silent carrier on their problem list.  Patient reports no complaints.  Contractions: Irritability. Vag. Bleeding: None.  Movement: Present. Denies any leaking of fluid.   The following portions of the patient's history were reviewed and updated as appropriate: allergies, current medications, past family history, past medical history, past social history, past surgical history and problem list.   Objective:  There were no vitals filed for this visit.  Fetal Status:     Movement: Present     General:  Alert, oriented and cooperative. Patient is in no acute distress.  Respiratory: Normal respiratory effort, no problems with respiration noted  Mental Status: Normal mood and affect. Normal behavior. Normal judgment and thought content.  Rest of physical exam deferred due to type of encounter  Imaging: [place] Mfm Ob Follow Up  Result Date: 01/07/2019 ----------------------------------------------------------------------  OBSTETRICS REPORT                       (Signed Final  01/07/2019 09:44 pm) ---------------------------------------------------------------------- Patient Info  ID #:       [id number]                          D.O.B.:  [birth year] (18 yrs)  Name:       Mallory Mcintyre                     Visit Date: 01/07/2019 01:12 pm ---------------------------------------------------------------------- Performed By  Performed By:     Birdena Crandall        Ref. Address:      Faculty                    RDMS,RVT  Attending:        Lin Landsman      [facility]                    MD                                        [facility]  Referred By:      Sharyon Cable CNM ---------------------------------------------------------------------- Orders   #  Description  Code         Ordered By   1  [place] MFM OB FOLLOW UP                  [id number]     Lin Landsman  ----------------------------------------------------------------------   #  Order #                    Accession #                 Episode #   1  [id number]                  [id number]                  [id number]  ---------------------------------------------------------------------- Indications   Antenatal follow-up for nonvisualized fetal    Z36.2   anatomy   Maternal thalassemia complicating              O99.012   pregnancy in second trimester (silent carrier)   Marijuana use   [redacted] weeks gestation of pregnancy                Z3A.22  ---------------------------------------------------------------------- Vital Signs                                                 Height:        5'5" ---------------------------------------------------------------------- Fetal Evaluation  Num Of Fetuses:          1  Fetal Heart Rate(bpm):   159  Cardiac Activity:        Observed  Presentation:            Breech  Placenta:                Right anterolateral  P. Cord Insertion:       Previously Visualized  Amniotic Fluid  AFI FV:       Within normal limits                              Largest Pocket(cm)                              4.38 ---------------------------------------------------------------------- Biometry  BPD:      49.8  mm     G. Age:  21w 0d          4  %    CI:        68.64   %    70 - 86                                                          FL/HC:       20.6  %    19.2 -  20.8  HC:      192.1  mm     G. Age:  21w 4d          4  %    HC/AC:       1.09       1.05 - 1.21  AC:      176.6  mm     G. Age:  22w 4d         37  %    FL/BPD:      79.3  %    71 - 87  FL:       39.5  mm     G. Age:  22w 5d         40  %    FL/AC:       22.4  %    20 - 24  CER:      23.6  mm     G. Age:  21w 6d         33  %  CM:          4  mm  Est. FW:     505   gm     1 lb 2 oz     46  % ---------------------------------------------------------------------- OB History  Gravidity:    1         Term:   0        Prem:   0        SAB:   0  TOP:          0       Ectopic:  0        Living: 0 ---------------------------------------------------------------------- Gestational Age  LMP:           22w 5d        Date:  08/01/18                 EDD:   05/08/19  U/S Today:     22w 0d                                        EDD:   05/13/19  Best:          22w 5d     Det. By:  LMP  (08/01/18)          EDD:   05/08/19 ---------------------------------------------------------------------- Anatomy  Cranium:               Appears normal         Aortic Arch:            Appears normal  Cavum:                 Appears normal         Ductal Arch:            Appears normal  Ventricles:            Appears normal         Diaphragm:              Appears normal  Choroid Plexus:        Appears normal         Stomach:                Appears normal, left  sided  Cerebellum:            Appears normal         Abdomen:                Appears normal  Posterior Fossa:       Appears normal         Abdominal Wall:          Appears nml (cord                                                                        insert, abd wall)  Nuchal Fold:           Not applicable (>20    Cord Vessels:           Appears normal ([redacted]                         wks GA)                                        vessel cord)  Face:                  Appears normal         Kidneys:                Appear normal                         (orbits and profile)  Lips:                  Previously seen        Bladder:                Appears normal  Thoracic:              Appears normal         Spine:                  Appears normal  Heart:                 Appears normal         Upper Extremities:      Previously seen                         (4CH, axis, and                         situs)  RVOT:                  Appears normal         Lower Extremities:      Previously seen  LVOT:                  Appears normal  Other:  Female gender.  Heels and Nasal bone previously visualized. ---------------------------------------------------------------------- Cervix Uterus Adnexa  Cervix  Length:           4.13  cm.  Normal appearance by  transabdominal scan.  Uterus  No abnormality visualized.  Cul De Sac  No free fluid seen. ---------------------------------------------------------------------- Impression  Normal interval growth.  Anatomy completed ---------------------------------------------------------------------- Recommendations  Follow up as clinically indicated. ----------------------------------------------------------------------               Lin Landsmanorenthian Booker, MD Electronically Signed Final Report   01/07/2019 09:44 pm ----------------------------------------------------------------------   Assessment and Plan:  Pregnancy: G1P0000 at 8253w5d 1. Supervision of normal first pregnancy, antepartum - Patient doing well, no complaints  - Anticipatory guidance on upcoming appointments with GTT at next appointment  - Routine prenatal care  - Encouraged use of babyscripts  app and entering BP into app weekly, encouraged to take BP weekly and write down even if app is not working on phone, patient verbalizes understanding  - Discussed US report with patient  - COVID19 precautions   Preterm labor symptoms and general obstetric precautions including but not limited to vaginal bleeding, contractions, leaking of fluid and fetal movement were reviewed in detail with the patient. I discussed the assessment and treatment plan with the patient. The patient was provided an opportunity to ask questions and all were answered. The patient agreed with the plan and demonstrated an understanding of the instructions. The patient was advised to call back or seek an in-person office evaluation/[facility] for any urgent or concerning symptoms. Please refer to After Visit Summary for other counseling recommendations.   I provided 10 minutes of face-to-face time during this encounter.  Return in about 4 weeks (around 02/11/2019) for ROB/GTT.  Future Appointments  Date Time Provider Department Center  02/11/2019  8:30 AM [facility] None  02/11/2019  8:45 AM Sharyon Cableogers, Terrin Imparato C, CNM [facility] None    Sharyon CableVeronica C Lakethia Coppess, CNM [facility], [facility]

## 2019-01-21 ENCOUNTER — Telehealth: Payer: Self-pay

## 2019-01-21 NOTE — Telephone Encounter (Signed)
Returned call and pt reported having cramping while at work. Pt reports fetal movement, denies vaginal bleeding, pressure and increased discharge. Advised pt to increase water intake, advised of signs of preterm labor, and to report to hospital if symptoms do not go away or get worse, pt agreed.

## 2019-02-11 ENCOUNTER — Other Ambulatory Visit: Payer: Medicaid Other

## 2019-02-11 ENCOUNTER — Encounter: Payer: Self-pay | Admitting: Certified Nurse Midwife

## 2019-02-11 ENCOUNTER — Ambulatory Visit (INDEPENDENT_AMBULATORY_CARE_PROVIDER_SITE_OTHER): Payer: Medicaid Other | Admitting: Certified Nurse Midwife

## 2019-02-11 ENCOUNTER — Other Ambulatory Visit: Payer: Self-pay

## 2019-02-11 VITALS — BP 110/71 | HR 97 | Wt 147.0 lb

## 2019-02-11 DIAGNOSIS — Z3400 Encounter for supervision of normal first pregnancy, unspecified trimester: Secondary | ICD-10-CM

## 2019-02-11 DIAGNOSIS — O99322 Drug use complicating pregnancy, second trimester: Secondary | ICD-10-CM

## 2019-02-11 DIAGNOSIS — Z3A27 27 weeks gestation of pregnancy: Secondary | ICD-10-CM

## 2019-02-11 DIAGNOSIS — F1290 Cannabis use, unspecified, uncomplicated: Secondary | ICD-10-CM

## 2019-02-11 NOTE — Patient Instructions (Signed)

## 2019-02-11 NOTE — Progress Notes (Signed)
   PRENATAL VISIT NOTE  Subjective:  Mallory Mcintyre is a 18 y.o. G1P0000 at [redacted]w[redacted]d being seen today for ongoing prenatal care.  She is currently monitored for the following issues for this low-risk pregnancy and has Scoliosis; Encounter for supervision of normal pregnancy, antepartum; Marijuana use; and Alpha thalassemia silent carrier on their problem list.  Patient reports no complaints.  Contractions: Not present. Vag. Bleeding: None.  Movement: Present. Denies leaking of fluid.   The following portions of the patient's history were reviewed and updated as appropriate: allergies, current medications, past family history, past medical history, past social history, past surgical history and problem list.   Objective:   Vitals:   02/11/19 0852  BP: 110/71  Pulse: 97  Weight: 147 lb (66.7 kg)    Fetal Status: Fetal Heart Rate (bpm): 135 Fundal Height: 28 cm Movement: Present     General:  Alert, oriented and cooperative. Patient is in no acute distress.  Skin: Skin is warm and dry. No rash noted.   Cardiovascular: Normal heart rate noted  Respiratory: Normal respiratory effort, no problems with respiration noted  Abdomen: Soft, gravid, appropriate for gestational age.  Pain/Pressure: Absent     Pelvic: Cervical exam deferred        Extremities: Normal range of motion.  Edema: None  Mental Status: Normal mood and affect. Normal behavior. Normal judgment and thought content.   Assessment and Plan:  Pregnancy: G1P0000 at [redacted]w[redacted]d 1. Supervision of normal first pregnancy, antepartum - Patient doing well, no complaints  - Routine prenatal care  - Anticipatory guidance on upcoming appointments with next being virtual appointment at 32 weeks  - Patient drank soda after midnight, currently not fasting- 1 hr completed, educated and discussed if she fails 1 hr would then be diagnosed with GDM or need to complete 2hr for confirmation, patient verbalizes understanding  - Glucose, 1 hour gestational  - HIV antibody (with reflex) - RPR - CBC  2. Marijuana use - Patient reports continued use of marijuana during pregnancy  - Has cut back and is currently smoking once per day but wants to stop  - Encouraged to set goal of stop day (next prenatal visit), continue to cut back each week until full cessation, patient verbalizes understanding  - Planned goals for cessation as followed: Week 1: every other day Week 2: 3 times a week Week 3: 1-2 times per week Week 4: none   Preterm labor symptoms and general obstetric precautions including but not limited to vaginal bleeding, contractions, leaking of fluid and fetal movement were reviewed in detail with the patient. Please refer to After Visit Summary for other counseling recommendations.   Return in about 4 weeks (around 03/11/2019) for ROB-webex .  Future Appointments  Date Time Provider [place]  03/11/2019  2:45 PM Lajean Manes, CNM [place] None    Lajean Manes, [place]

## 2019-02-11 NOTE — Progress Notes (Signed)
Subjective: Mallory Mcintyre is a G1P0000 at [redacted]w[redacted]d who presents to the [facility] today for ob visit  She does not have a history of any mental health concerns. She not currently sexually active. Patient reports history with birth control pills. Patient states family and mother as her support system.   BP 110/71   Pulse 97   Wt 147 lb (66.7 kg)   LMP 08/01/2018   BMI 24.46 kg/m   Birth Control History:  pills  MDM Patient counseled on all options for birth control today including LARC. Patient is considering depo initiated for birth control.   Assessment:  18 y.o. female considering depo for birth control  Plan: Continued support   Lynnea Ferrier, Marlinda Mike 02/11/2019 12:04 PM

## 2019-02-11 NOTE — Progress Notes (Signed)
Patient reports fetal movement, denies pain. 

## 2019-02-12 LAB — GLUCOSE, 1 HOUR GESTATIONAL: Gestational Diabetes Screen: 79 mg/dL (ref 65–139)

## 2019-02-12 LAB — CBC
Hematocrit: 33.8 % — ABNORMAL LOW (ref 34.0–46.6)
Hemoglobin: 10.7 g/dL — ABNORMAL LOW (ref 11.1–15.9)
MCH: 27 pg (ref 26.6–33.0)
MCHC: 31.7 g/dL (ref 31.5–35.7)
MCV: 85 fL (ref 79–97)
Platelets: 301 10*3/uL (ref 150–450)
RBC: 3.97 x10E6/uL (ref 3.77–5.28)
RDW: 11.8 % (ref 11.7–15.4)
WBC: 9.1 10*3/uL (ref 3.4–10.8)

## 2019-02-12 LAB — HIV ANTIBODY (ROUTINE TESTING W REFLEX): HIV Screen 4th Generation wRfx: NONREACTIVE

## 2019-02-12 LAB — RPR: RPR Ser Ql: NONREACTIVE

## 2019-02-24 ENCOUNTER — Telehealth: Payer: Self-pay | Admitting: *Deleted

## 2019-02-24 NOTE — Telephone Encounter (Signed)
Pt called to office to discuss breakouts she had been having on face and chest.  Attempt to return call.  No answer, LM on VM to call as needed.

## 2019-02-27 ENCOUNTER — Other Ambulatory Visit: Payer: Self-pay

## 2019-02-27 ENCOUNTER — Encounter (HOSPITAL_COMMUNITY): Payer: Self-pay

## 2019-02-27 ENCOUNTER — Inpatient Hospital Stay (HOSPITAL_COMMUNITY)
Admission: AD | Admit: 2019-02-27 | Discharge: 2019-02-28 | Disposition: A | Discharge status: Home / Self Care | Payer: Medicaid Other | Attending: Obstetrics and Gynecology | Admitting: Obstetrics and Gynecology

## 2019-02-27 DIAGNOSIS — Z87891 Personal history of nicotine dependence: Secondary | ICD-10-CM | POA: Insufficient documentation

## 2019-02-27 DIAGNOSIS — Z981 Arthrodesis status: Secondary | ICD-10-CM | POA: Insufficient documentation

## 2019-02-27 DIAGNOSIS — Z3689 Encounter for other specified antenatal screening: Secondary | ICD-10-CM

## 2019-02-27 DIAGNOSIS — O360930 Maternal care for other rhesus isoimmunization, third trimester, not applicable or unspecified: Secondary | ICD-10-CM

## 2019-02-27 DIAGNOSIS — Z3A30 30 weeks gestation of pregnancy: Secondary | ICD-10-CM | POA: Insufficient documentation

## 2019-02-27 DIAGNOSIS — O4693 Antepartum hemorrhage, unspecified, third trimester: Secondary | ICD-10-CM | POA: Insufficient documentation

## 2019-02-27 DIAGNOSIS — Z6790 Unspecified blood type, Rh positive: Secondary | ICD-10-CM

## 2019-02-27 NOTE — MAU Provider Note (Signed)
History     CSN: [id number]  Arrival date and time: 02/27/19 2250   First Provider Initiated Contact with Patient 02/27/19 2357      Chief Complaint  Patient presents with  . Vaginal Bleeding  . Abdominal Pain   Ms. Mallory Mcintyre is a 18 y.o. G1P0000 at 58w0dwho presents to MAU for vaginal bleeding which began yesterday AM. Pt reports yesterday it was pink spotting, then today had dark red spotting. Pt denies having to wear pads and reports blood is only present when she wipes. Pt reports last intercourse 2days ago.  Passing blood clots? no Blood soaking clothes? no Lightheaded/dizzy? no Significant pelvic pain or cramping/ctx? no, pt reports mild menstrual-like cramping, intermittent, pt reports earlier she was having lower abdominal pain and pain in her low back, but that is not present currently Passed any tissue? no Recent trauma? no Prior abruption? no Smoking/drug use? no HTN? no PPROM? no Current pregnancy problems? pt has Alpha Thalassemia Hx of C/S or GYN surgery? no Hx of recurrent pregnancy loss/associated condition? no Blood Type? B Positive Allergies? NKDA Current medications? PNVs Current PNC & next appt? [facility], 03/11/2019   OB History    Gravida  1   Para  0   Term  0   Preterm  0   AB  0   Living  0     SAB  0   TAB  0   Ectopic  0   Multiple  0   Live Births  0           Past Medical History:  Diagnosis Date  . Scoliosis     Past Surgical History:  Procedure Laterality Date  . SPINAL FUSION  08/12/2013    Family History  Problem Relation Age of Onset  . Diabetes Other   . Asthma Other   . Hypertension Other     Social History   Tobacco Use  . Smoking status: Former Smoker    Types: Cigars  . Smokeless tobacco: Never Used  . Tobacco comment: 2-3 Black n mild daily   Substance Use Topics  . Alcohol use: No  . Drug use: Yes    Types: Marijuana    Allergies: No Known Allergies  Medications Prior to Admission   Medication Sig Dispense Refill Last Dose  . acetaminophen (TYLENOL) 325 MG tablet Take 650 mg by mouth every 6 (six) hours as needed.   Past Week at Unknown time  . Prenatal MV-Min-FA-Omega-3 (PRENATAL GUMMIES/DHA & FA) 0.4-32.5 MG CHEW Chew by mouth.   02/26/2019 at Unknown time  . Blood Pressure Monitor KIT 1 each by Does not apply route daily. (Patient not taking: Reported on 01/14/2019) 1 each 0   . ondansetron (ZOFRAN ODT) 4 MG disintegrating tablet Take 1 tablet (4 mg total) by mouth every 6 (six) hours as needed for nausea or vomiting. (Patient not taking: Reported on 12/10/2018) 30 tablet 0   . terconazole (TERAZOL 3) 0.8 % vaginal cream Place 1 applicator vaginally at bedtime. (Patient not taking: Reported on 12/10/2018) 20 g 0     Review of Systems  Constitutional: Negative for chills, diaphoresis, fatigue and fever.  Respiratory: Negative for shortness of breath.   Cardiovascular: Negative for chest pain.  Gastrointestinal: Negative for abdominal pain, constipation, diarrhea, nausea and vomiting.  Genitourinary: Positive for pelvic pain and vaginal bleeding. Negative for dysuria, flank pain, frequency, urgency and vaginal discharge.  Neurological: Negative for dizziness, weakness, light-headedness and headaches.   Physical  Exam   Blood pressure (!) 103/53, pulse (!) 56, temperature 98.5 F (36.9 C), temperature source Oral, resp. rate 16, height '5\' 5"'$  (1.651 m), weight 66.8 kg, last menstrual period 08/01/2018, SpO2 100 %.  Patient Vitals for the past 24 hrs:  BP Temp Temp src Pulse Resp SpO2 Height Weight  02/27/19 2303 (!) 103/53 98.5 F (36.9 C) Oral (!) 56 16 100 % '5\' 5"'$  (1.651 m) 66.8 kg   Physical Exam  Constitutional: She is oriented to person, place, and time. She appears well-developed and well-nourished. No distress.  HENT:  Head: Normocephalic and atraumatic.  Respiratory: Effort normal.  GI: Soft. She exhibits no distension and no mass. There is no abdominal  tenderness. There is no rebound and no guarding.  Genitourinary: There is no rash, tenderness or lesion on the right labia. There is no rash, tenderness or lesion on the left labia. Uterus is enlarged. Uterus is not tender. Cervix exhibits discharge and friability. Cervix exhibits no motion tenderness.    Vaginal bleeding present.     No vaginal discharge or tenderness.  There is bleeding in the vagina. No tenderness in the vagina.    Genitourinary Comments: -CE: long/posterior/1cm, blood on glove after cervical exam -external os visually closed -minimal blood visible on labia minora and in vagina -blood mixed with mucus present at external os   Neurological: She is alert and oriented to person, place, and time.  Skin: Skin is warm and dry. She is not diaphoretic.  Psychiatric: She has a normal mood and affect. Her behavior is normal. Judgment and thought content normal.   Results for orders placed or performed during the hospital encounter of 02/27/19 (from the past 24 hour(s))  Urinalysis, Routine w reflex microscopic     Status: Abnormal   Collection Time: 02/28/19 12:14 AM  Result Value Ref Range   Color, Urine STRAW (A) YELLOW   APPearance CLEAR CLEAR   Specific Gravity, Urine 1.012 1.005 - 1.030   pH 7.0 5.0 - 8.0   Glucose, UA NEGATIVE NEGATIVE mg/dL   Hgb urine dipstick SMALL (A) NEGATIVE   Bilirubin Urine NEGATIVE NEGATIVE   Ketones, ur NEGATIVE NEGATIVE mg/dL   Protein, ur NEGATIVE NEGATIVE mg/dL   Nitrite NEGATIVE NEGATIVE   Leukocytes,Ua NEGATIVE NEGATIVE   RBC / HPF 0-5 0 - 5 RBC/hpf   WBC, UA 0-5 0 - 5 WBC/hpf   Bacteria, UA NONE SEEN NONE SEEN   Mucus PRESENT   Wet prep, genital     Status: Abnormal   Collection Time: 02/28/19 12:14 AM   Specimen: Urine, Clean Catch  Result Value Ref Range   Yeast Wet Prep HPF POC NONE SEEN NONE SEEN   Trich, Wet Prep NONE SEEN NONE SEEN   Clue Cells Wet Prep HPF POC NONE SEEN NONE SEEN   WBC, Wet Prep HPF POC MANY (A) NONE SEEN    Sperm NONE SEEN   CBC     Status: Abnormal   Collection Time: 02/28/19  1:03 AM  Result Value Ref Range   WBC 9.0 4.0 - 10.5 K/uL   RBC 3.83 (L) 3.87 - 5.11 MIL/uL   Hemoglobin 10.5 (L) 12.0 - 15.0 g/dL   HCT 33.8 (L) 36.0 - 46.0 %   MCV 88.3 80.0 - 100.0 fL   MCH 27.4 26.0 - 34.0 pg   MCHC 31.1 30.0 - 36.0 g/dL   RDW 12.5 11.5 - 15.5 %   Platelets 291 150 - 400 K/uL   nRBC 0.0  0.0 - 0.2 %   No results found.  MAU Course  Procedures  MDM -VB in third trimester, no risk factors for abruption -minimal blood visible on labia minora and in vagina -blood mixed with mucus present at external os, cervix friable -external os visually closed -CE: long/posterior/1cm, minimal blood on glove after cervical exam -UA: straw/sm hgb, otherwise WNL -CBC: H/H 10.5/33.8, RBCs 3.83, otherwise WNL -WetPrep: many WBCs, otherwise WNL -GC/CT collected -EFM: reactive with variables       -baseline: 145       -variability: moderate       -accels: present, 15x15       -decels: irregular variables       -TOCO: minor irritability, single ctx -spoke with Dr. Elly Modena '@[id number]'$ , pt OK to be discharged home after discussion of above information -pt discharged to home in stable condition  Orders Placed This Encounter  Procedures  . Wet prep, genital    Standing Status:   Standing    Number of Occurrences:   1  . Urinalysis, Routine w reflex microscopic    Standing Status:   Standing    Number of Occurrences:   1  . CBC    Standing Status:   Standing    Number of Occurrences:   1  . Discharge patient    Order Specific Question:   Discharge disposition    Answer:   01-Home or Self Care [1]    Order Specific Question:   Discharge patient date    Answer:   02/28/2019   No orders of the defined types were placed in this encounter.  Assessment and Plan   1. Vaginal bleeding in pregnancy, third trimester   2. Blood type, Rh positive   3. NST (non-stress test) reactive    Allergies as of  02/28/2019   No Known Allergies     Medication List    TAKE these medications   acetaminophen 325 MG tablet Commonly known as: TYLENOL Take 650 mg by mouth every 6 (six) hours as needed.   Blood Pressure Monitor Kit 1 each by Does not apply route daily.   ondansetron 4 MG disintegrating tablet Commonly known as: Zofran ODT Take 1 tablet (4 mg total) by mouth every 6 (six) hours as needed for nausea or vomiting.   Prenatal Gummies/DHA & FA 0.4-32.5 MG Chew Chew by mouth.   terconazole 0.8 % vaginal cream Commonly known as: Terazol 3 Place 1 applicator vaginally at bedtime.      -will call with culture results, if positive -discussed post-coital bleeding in pregnancy -VB/warning signs/PTL/return MAU precautions discussed -pt discharged to home in stable condition  Kishbaugh E Todd Jelinski 02/28/2019, 2:38 AM

## 2019-02-27 NOTE — MAU Note (Signed)
Pt here for light spotting, was pink, but now darker, and cramping that started yesterday. Denies LOF. Reports good fetal movement. Pt reports she had intercourse on Thursday.

## 2019-02-28 DIAGNOSIS — O360930 Maternal care for other rhesus isoimmunization, third trimester, not applicable or unspecified: Secondary | ICD-10-CM | POA: Diagnosis not present

## 2019-02-28 DIAGNOSIS — N939 Abnormal uterine and vaginal bleeding, unspecified: Secondary | ICD-10-CM | POA: Diagnosis present

## 2019-02-28 DIAGNOSIS — Z87891 Personal history of nicotine dependence: Secondary | ICD-10-CM | POA: Diagnosis not present

## 2019-02-28 DIAGNOSIS — Z3689 Encounter for other specified antenatal screening: Secondary | ICD-10-CM | POA: Diagnosis not present

## 2019-02-28 DIAGNOSIS — Z3A30 30 weeks gestation of pregnancy: Secondary | ICD-10-CM | POA: Diagnosis not present

## 2019-02-28 DIAGNOSIS — Z981 Arthrodesis status: Secondary | ICD-10-CM | POA: Diagnosis not present

## 2019-02-28 DIAGNOSIS — O4693 Antepartum hemorrhage, unspecified, third trimester: Secondary | ICD-10-CM | POA: Diagnosis not present

## 2019-02-28 LAB — URINALYSIS, ROUTINE W REFLEX MICROSCOPIC
Bacteria, UA: NONE SEEN
Bilirubin Urine: NEGATIVE
Glucose, UA: NEGATIVE mg/dL
Ketones, ur: NEGATIVE mg/dL
Leukocytes,Ua: NEGATIVE
Nitrite: NEGATIVE
Protein, ur: NEGATIVE mg/dL
Specific Gravity, Urine: 1.012 (ref 1.005–1.030)
pH: 7 (ref 5.0–8.0)

## 2019-02-28 LAB — CBC
HCT: 33.8 % — ABNORMAL LOW (ref 36.0–46.0)
Hemoglobin: 10.5 g/dL — ABNORMAL LOW (ref 12.0–15.0)
MCH: 27.4 pg (ref 26.0–34.0)
MCHC: 31.1 g/dL (ref 30.0–36.0)
MCV: 88.3 fL (ref 80.0–100.0)
Platelets: 291 10*3/uL (ref 150–400)
RBC: 3.83 MIL/uL — ABNORMAL LOW (ref 3.87–5.11)
RDW: 12.5 % (ref 11.5–15.5)
WBC: 9 10*3/uL (ref 4.0–10.5)
nRBC: 0 % (ref 0.0–0.2)

## 2019-02-28 LAB — WET PREP, GENITAL
Clue Cells Wet Prep HPF POC: NONE SEEN
Sperm: NONE SEEN
Trich, Wet Prep: NONE SEEN
Yeast Wet Prep HPF POC: NONE SEEN

## 2019-02-28 NOTE — Discharge Instructions (Signed)
Preterm Labor and Birth Information ° °The normal length of a pregnancy is 39-41 weeks. Preterm labor is when labor starts before 37 completed weeks of pregnancy. °What are the risk factors for preterm labor? °Preterm labor is more likely to occur in women who: °· Have certain infections during pregnancy such as a bladder infection, sexually transmitted infection, or infection inside the uterus (chorioamnionitis). °· Have a shorter-than-normal cervix. °· Have gone into preterm labor before. °· Have had surgery on their cervix. °· Are younger than age 17 or older than age 35. °· Are African American. °· Are pregnant with twins or multiple babies (multiple gestation). °· Take street drugs or smoke while pregnant. °· Do not gain enough weight while pregnant. °· Became pregnant shortly after having been pregnant. °What are the symptoms of preterm labor? °Symptoms of preterm labor include: °· Cramps similar to those that can happen during a menstrual period. The cramps may happen with diarrhea. °· Pain in the abdomen or lower back. °· Regular uterine contractions that may feel like tightening of the abdomen. °· A feeling of increased pressure in the pelvis. °· Increased watery or bloody mucus discharge from the vagina. °· Water breaking (ruptured amniotic sac). °Why is it important to recognize signs of preterm labor? °It is important to recognize signs of preterm labor because babies who are born prematurely may not be fully developed. This can put them at an increased risk for: °· Long-term (chronic) heart and lung problems. °· Difficulty immediately after birth with regulating body systems, including blood sugar, body temperature, heart rate, and breathing rate. °· Bleeding in the brain. °· Cerebral palsy. °· Learning difficulties. °· Death. °These risks are highest for babies who are born before 34 weeks of pregnancy. °How is preterm labor treated? °Treatment depends on the length of your pregnancy, your condition,  and the health of your baby. It may involve: °· Having a stitch (suture) placed in your cervix to prevent your cervix from opening too early (cerclage). °· Taking or being given medicines, such as: °? Hormone medicines. These may be given early in pregnancy to help support the pregnancy. °? Medicine to stop contractions. °? Medicines to help mature the baby’s lungs. These may be prescribed if the risk of delivery is high. °? Medicines to prevent your baby from developing cerebral palsy. °If the labor happens before 34 weeks of pregnancy, you may need to stay in the hospital. °What should I do if I think I am in preterm labor? °If you think that you are going into preterm labor, call your health care provider right away. °How can I prevent preterm labor in future pregnancies? °To increase your chance of having a full-term pregnancy: °· Do not use any tobacco products, such as cigarettes, chewing tobacco, and e-cigarettes. If you need help quitting, ask your health care provider. °· Do not use street drugs or medicines that have not been prescribed to you during your pregnancy. °· Talk with your health care provider before taking any herbal supplements, even if you have been taking them regularly. °· Make sure you gain a healthy amount of weight during your pregnancy. °· Watch for infection. If you think that you might have an infection, get it checked right away. °· Make sure to tell your health care provider if you have gone into preterm labor before. °This information is not intended to replace advice given to you by your health care provider. Make sure you discuss any questions you have with your   health care provider. Document Released: 10/19/2003 Document Revised: 11/20/2018 Document Reviewed: 12/20/2015 Elsevier Patient Education  2020 [employer]. Vaginal Bleeding During Pregnancy, Third Trimester  A small amount of bleeding from the vagina (spotting) is relatively common during pregnancy. Various things  can cause bleeding or spotting during pregnancy. Sometimes bleeding is normal and is not a problem. However, bleeding during the third trimester can also be a sign of something serious for the mother and the baby. Be sure to tell your health care provider about any vaginal bleeding right away. Some possible causes of vaginal bleeding during the third trimester include:  Infection or growths (polyps) on the cervix.  A condition in which the placenta partially or completely covers the opening of the cervix inside the uterus (placenta previa).  The placenta separating from the uterus (placenta abruption).  The start of labor (discharging of the mucus plug).  A condition in which the placenta grows into the muscle layer of the uterus (placenta accreta). Follow these instructions at home: Activity  Follow instructions from your health care provider about limiting your activity. If your health care provider recommends activity restriction, you may need to stay in bed and only get up to use the bathroom. In some cases, your health care provider may allow you to continue light activity.  If needed, make plans for someone to help with your regular activities.  Ask your health care provider if it is safe for you to drive.  Do not lift anything that is heavier than 10 lb (4.5 kg), or the limit that your health care provider tells you, until he or she says that it is safe.  Do not have sex or orgasms until your health care provider says that this is safe. Medicines  Take over-the-counter and prescription medicines only as told by your health care provider.  Do not take aspirin because it can cause bleeding. General instructions  Pay attention to any changes in your symptoms.  Write down how many pads you use each day, how often you change pads, and how soaked (saturated) they are.  Do not use tampons or douche.  If you pass any tissue from your vagina, save the tissue so you can show it to  your health care provider.  Keep all follow-up visits as told by your health care provider. This is important. Contact a health care provider if:  You have vaginal bleeding during any part of your pregnancy.  You have cramps or labor pains.  You have a fever. Get help right away if:  You have severe cramps or pain in your back or abdomen.  You have a gush of fluid from the vagina.  You pass large clots or a large amount of tissue from your vagina.  Your bleeding increases.  You feel light-headed or weak.  You faint.  You feel that your baby is moving less than usual, or not moving at all. Summary  Various things can cause bleeding or spotting in pregnancy.  Bleeding during the third trimester can be a sign of a serious problem for the mother and the baby.  Be sure to tell your health care provider about any vaginal bleeding right away. This information is not intended to replace advice given to you by your health care provider. Make sure you discuss any questions you have with your health care provider. Document Released: 10/19/2002 Document Revised: 11/17/2018 Document Reviewed: 10/31/2016 Elsevier Patient Education  2020 [employer].

## 2019-03-03 ENCOUNTER — Telehealth: Payer: Self-pay

## 2019-03-03 LAB — GC/CHLAMYDIA PROBE AMP (~~LOC~~) NOT AT ARMC
Chlamydia: NEGATIVE
Neisseria Gonorrhea: NEGATIVE

## 2019-03-03 NOTE — Telephone Encounter (Signed)
Returned call and pt stated that she has been having pain right before she urinates and urgency. Advised pt that scheduler will contact for appt to leave urine sample, pt agreed.

## 2019-03-04 ENCOUNTER — Other Ambulatory Visit: Payer: Self-pay

## 2019-03-04 ENCOUNTER — Ambulatory Visit (INDEPENDENT_AMBULATORY_CARE_PROVIDER_SITE_OTHER): Payer: Medicaid Other

## 2019-03-04 DIAGNOSIS — R300 Dysuria: Secondary | ICD-10-CM

## 2019-03-04 DIAGNOSIS — R350 Frequency of micturition: Secondary | ICD-10-CM

## 2019-03-04 NOTE — Progress Notes (Signed)
SUBJECTIVE: Mallory Mcintyre is a 18 y.o. female who complains of urinary frequency, urgency and dysuria x couple of days, without flank pain, fever, chills, or abnormal vaginal discharge or bleeding.   OBJECTIVE: Appears well, in no apparent distress.  Vital signs are normal. Urine dipstick shows negative for all components.    ASSESSMENT: Dysuria  PLAN: Treatment per orders.  Call or return to clinic prn if these symptoms worsen or fail to improve as anticipated.

## 2019-03-04 NOTE — Progress Notes (Signed)
Agree with A & P. 

## 2019-03-08 ENCOUNTER — Other Ambulatory Visit: Payer: Self-pay

## 2019-03-08 ENCOUNTER — Telehealth: Payer: Self-pay

## 2019-03-08 DIAGNOSIS — R300 Dysuria: Secondary | ICD-10-CM

## 2019-03-08 LAB — URINE CULTURE, OB REFLEX

## 2019-03-08 LAB — CULTURE, OB URINE

## 2019-03-08 MED ORDER — CLINDAMYCIN HCL 300 MG PO CAPS: 300.0000 mg | ORAL_CAPSULE | Freq: Four times a day (QID) | ORAL | 0 refills | Status: AC

## 2019-03-08 NOTE — Telephone Encounter (Signed)
Return call to pt regarding message. Pt c/o sometimes having dizziness, HA's, visual changes denies sx's today.  pt notes taking tylenol for HA's gives relief.  Denies any swelling, +FM , no LOF  B/P: 120/76 P:102 (while on triage call I asked pt to check B/P)  Pt advised if sx's return she needs to report to MAU.  Pt voiced understanding of labor precautions and PIH sx's.  Pt advised to keep scheduled appt,

## 2019-03-11 ENCOUNTER — Other Ambulatory Visit: Payer: Self-pay

## 2019-03-11 ENCOUNTER — Ambulatory Visit (INDEPENDENT_AMBULATORY_CARE_PROVIDER_SITE_OTHER): Payer: Medicaid Other | Admitting: Certified Nurse Midwife

## 2019-03-11 ENCOUNTER — Encounter: Payer: Self-pay | Admitting: Certified Nurse Midwife

## 2019-03-11 VITALS — BP 107/82

## 2019-03-11 DIAGNOSIS — Z3400 Encounter for supervision of normal first pregnancy, unspecified trimester: Secondary | ICD-10-CM

## 2019-03-11 DIAGNOSIS — O99323 Drug use complicating pregnancy, third trimester: Secondary | ICD-10-CM

## 2019-03-11 DIAGNOSIS — Z3A31 31 weeks gestation of pregnancy: Secondary | ICD-10-CM

## 2019-03-11 DIAGNOSIS — O99013 Anemia complicating pregnancy, third trimester: Secondary | ICD-10-CM

## 2019-03-11 DIAGNOSIS — F1290 Cannabis use, unspecified, uncomplicated: Secondary | ICD-10-CM

## 2019-03-11 MED ORDER — FERROUS SULFATE 325 (65 FE) MG PO TABS: 325.0000 mg | ORAL_TABLET | Freq: Every day | ORAL | 1 refills | Status: DC

## 2019-03-11 NOTE — Progress Notes (Signed)
[facility] VIRTUAL VIDEO VISIT ENCOUNTER NOTE  Provider location: [facility] [place]   I connected with Mallory Mcintyre on 03/11/19 at  2:58 PM EDT by WebEx Video Encounter at home and verified that I am speaking with the correct person using two identifiers.   I discussed the limitations, risks, security and privacy concerns of performing an evaluation and management service virtually and the availability of in person appointments. I also discussed with the patient that there may be a patient responsible charge related to this service. The patient expressed understanding and agreed to proceed. Subjective:  Mallory Mcintyre is a 18 y.o. G1P0000 at [redacted]w[redacted]d being seen today for ongoing prenatal care.  She is currently monitored for the following issues for this low-risk pregnancy and has Scoliosis; Encounter for supervision of normal pregnancy, antepartum; Marijuana use; and Alpha thalassemia silent carrier on their problem list.  Patient reports dizziness and lightheaded at work .  Contractions: Not present. Vag. Bleeding: None.  Movement: Present. Denies any leaking of fluid.   The following portions of the patient's history were reviewed and updated as appropriate: allergies, current medications, past family history, past medical history, past social history, past surgical history and problem list.   Objective:   Vitals:   03/11/19 1453  BP: 107/82    Fetal Status:     Movement: Present     General:  Alert, oriented and cooperative. Patient is in no acute distress.  Respiratory: Normal respiratory effort, no problems with respiration noted  Mental Status: Normal mood and affect. Normal behavior. Normal judgment and thought content.  Rest of physical exam deferred due to type of encounter  Imaging: No results found.  Assessment and Plan:  Pregnancy: G1P0000 at [redacted]w[redacted]d 1. Supervision of normal first pregnancy, antepartum - Patient doing well - Routine  prenatal care - Anticipatory guidance on upcoming appointment with [facility] at next visit  - Discussed maternity leave with patient, as she wants to go out sooner than later, only gets 6 weeks with current job. Discussed giving COVID precautions letter at 36 weeks to be able to go on leave.   2. Marijuana use - Cessation of marijuana, patient stopped on 7/4  3. Anemia affecting pregnancy in third trimester - Patient reports being dizzy and lightheaded at work occasionally  - Hgb 10.5 on last CBC  - Educated and discussed keeping protein snacks on her at work to help with dizziness  - ferrous sulfate (FERROUSUL) 325 (65 FE) MG tablet; Take 1 tablet (325 mg total) by mouth daily with breakfast.  Dispense: 60 tablet; Refill: 1  Preterm labor symptoms and general obstetric precautions including but not limited to vaginal bleeding, contractions, leaking of fluid and fetal movement were reviewed in detail with the patient. I discussed the assessment and treatment plan with the patient. The patient was provided an opportunity to ask questions and all were answered. The patient agreed with the plan and demonstrated an understanding of the instructions. The patient was advised to call back or seek an in-person office evaluation/go to [facility] at [facility] for any urgent or concerning symptoms. Please refer to After Visit Summary for other counseling recommendations.   I provided 15 minutes of face-to-face time during this encounter.  Return in about 1 month (around 04/12/2019) for ROB/GBS.  Future Appointments  Date Time Provider [place]  04/12/2019  9:15 AM [place]-Kir[place], Kathie Dike, CNM [place] None    Lajean Manes, [place] [facility], [place]

## 2019-03-11 NOTE — Progress Notes (Signed)
Pt is on the phone preparing for virtual visit with provider. [redacted]w[redacted]d.  

## 2019-03-12 ENCOUNTER — Encounter (HOSPITAL_COMMUNITY): Payer: Self-pay | Admitting: *Deleted

## 2019-03-12 ENCOUNTER — Telehealth: Payer: Self-pay

## 2019-03-12 ENCOUNTER — Inpatient Hospital Stay (HOSPITAL_COMMUNITY)
Admission: AD | Admit: 2019-03-12 | Discharge: 2019-03-12 | Disposition: A | Discharge status: Home / Self Care | Payer: Medicaid Other | Attending: Family Medicine | Admitting: Family Medicine

## 2019-03-12 ENCOUNTER — Other Ambulatory Visit: Payer: Self-pay

## 2019-03-12 DIAGNOSIS — Z87891 Personal history of nicotine dependence: Secondary | ICD-10-CM | POA: Insufficient documentation

## 2019-03-12 DIAGNOSIS — R51 Headache: Secondary | ICD-10-CM | POA: Diagnosis not present

## 2019-03-12 DIAGNOSIS — O26893 Other specified pregnancy related conditions, third trimester: Secondary | ICD-10-CM | POA: Insufficient documentation

## 2019-03-12 DIAGNOSIS — O99013 Anemia complicating pregnancy, third trimester: Secondary | ICD-10-CM | POA: Diagnosis not present

## 2019-03-12 DIAGNOSIS — Z3A31 31 weeks gestation of pregnancy: Secondary | ICD-10-CM | POA: Insufficient documentation

## 2019-03-12 DIAGNOSIS — Z3A32 32 weeks gestation of pregnancy: Secondary | ICD-10-CM | POA: Diagnosis not present

## 2019-03-12 DIAGNOSIS — Z3689 Encounter for other specified antenatal screening: Secondary | ICD-10-CM | POA: Diagnosis not present

## 2019-03-12 DIAGNOSIS — R42 Dizziness and giddiness: Secondary | ICD-10-CM | POA: Insufficient documentation

## 2019-03-12 DIAGNOSIS — R0602 Shortness of breath: Secondary | ICD-10-CM | POA: Diagnosis not present

## 2019-03-12 LAB — COMPREHENSIVE METABOLIC PANEL
ALT: 12 U/L (ref 0–44)
AST: 13 U/L — ABNORMAL LOW (ref 15–41)
Albumin: 2.6 g/dL — ABNORMAL LOW (ref 3.5–5.0)
Alkaline Phosphatase: 96 U/L (ref 38–126)
Anion gap: 8 (ref 5–15)
BUN: <5 mg/dL — ABNORMAL LOW (ref 6–20)
CO2: 22 mmol/L (ref 22–32)
Calcium: 8.5 mg/dL — ABNORMAL LOW (ref 8.9–10.3)
Chloride: 106 mmol/L (ref 98–111)
Creatinine, Ser: 0.53 mg/dL (ref 0.44–1.00)
GFR calc Af Amer: >60 mL/min (ref >60)
GFR calc non Af Amer: >60 mL/min (ref >60)
Glucose, Bld: 85 mg/dL (ref 70–99)
Potassium: 3.5 mmol/L (ref 3.5–5.1)
Sodium: 136 mmol/L (ref 135–145)
Total Bilirubin: 0.4 mg/dL (ref 0.3–1.2)
Total Protein: 6 g/dL — ABNORMAL LOW (ref 6.5–8.1)

## 2019-03-12 LAB — CBC WITH DIFFERENTIAL/PLATELET
Abs Immature Granulocytes: 0.12 10*3/uL — ABNORMAL HIGH (ref 0.00–0.07)
Basophils Absolute: 0 10*3/uL (ref 0.0–0.1)
Basophils Relative: 0 %
Eosinophils Absolute: 0 10*3/uL (ref 0.0–0.5)
Eosinophils Relative: 0 %
HCT: 32.8 % — ABNORMAL LOW (ref 36.0–46.0)
Hemoglobin: 10.3 g/dL — ABNORMAL LOW (ref 12.0–15.0)
Immature Granulocytes: 1 %
Lymphocytes Relative: 23 %
Lymphs Abs: 2.4 10*3/uL (ref 0.7–4.0)
MCH: 27 pg (ref 26.0–34.0)
MCHC: 31.4 g/dL (ref 30.0–36.0)
MCV: 86.1 fL (ref 80.0–100.0)
Monocytes Absolute: 1 10*3/uL (ref 0.1–1.0)
Monocytes Relative: 10 %
Neutro Abs: 7 10*3/uL (ref 1.7–7.7)
Neutrophils Relative %: 66 %
Platelets: 319 10*3/uL (ref 150–400)
RBC: 3.81 MIL/uL — ABNORMAL LOW (ref 3.87–5.11)
RDW: 12.6 % (ref 11.5–15.5)
WBC: 10.7 10*3/uL — ABNORMAL HIGH (ref 4.0–10.5)
nRBC: 0 % (ref 0.0–0.2)

## 2019-03-12 LAB — RAPID URINE DRUG SCREEN, HOSP PERFORMED
Amphetamines: NOT DETECTED
Barbiturates: NOT DETECTED
Benzodiazepines: NOT DETECTED
Cocaine: NOT DETECTED
Opiates: NOT DETECTED
Tetrahydrocannabinol: NOT DETECTED

## 2019-03-12 LAB — URINALYSIS, ROUTINE W REFLEX MICROSCOPIC
Bilirubin Urine: NEGATIVE
Glucose, UA: NEGATIVE mg/dL
Hgb urine dipstick: NEGATIVE
Ketones, ur: NEGATIVE mg/dL
Leukocytes,Ua: NEGATIVE
Nitrite: NEGATIVE
Protein, ur: NEGATIVE mg/dL
Specific Gravity, Urine: 1.015 (ref 1.005–1.030)
pH: 8 (ref 5.0–8.0)

## 2019-03-12 MED ORDER — ACETAMINOPHEN 500 MG PO TABS
1000.0000 mg | ORAL_TABLET | Freq: Once | ORAL | Status: AC
  Administered 2019-03-12: 1000 mg via ORAL
  Filled 2019-03-12: qty 2

## 2019-03-12 NOTE — MAU Provider Note (Signed)
History     CSN: [id number]  Arrival date and time: 03/12/19 1654   First Provider Initiated Contact with Patient 03/12/19 1742     Chief Complaint  Patient presents with  . Headache  . Dizziness   HPI Mallory Mcintyre is a 18 y.o. G1P0000 at 52w6dwho presents to MAU with complaints of headache, dizziness and SOB.  She denies vaginal bleeding, leaking of fluid, decreased fetal movement, fever, falls, or recent illness.   Headache This is a recurring problem. She reports one week history of anterior headache, pain score 10/10. Her pain does not radiate. Mild relief provided by PO Tylenol but she is out of pills at home. Patient endorses history of headaches but states she hasn't had one in awhile. She most recently took Tylenol at around 0800 today.  Dizziness and SOB These are recurring problems, onset within the past two weeks. Patient states rapid position changes consistently trigger episodes of dizziness and shortness of breath. This is making it hard for her to work her job at [employer] where she is frequently on her feet for the entire shift. She denies weakness, syncope, chest pain, palpitations. Patient endorses history of mild anemia. She states she started her new prescription Fe supplement  this morning and she is surprised she has not noticed an improvement in her symptoms  OB History    Gravida  1   Para  0   Term  0   Preterm  0   AB  0   Living  0     SAB  0   TAB  0   Ectopic  0   Multiple  0   Live Births  0           Past Medical History:  Diagnosis Date  . Scoliosis     Past Surgical History:  Procedure Laterality Date  . SPINAL FUSION  08/12/2013    Family History  Problem Relation Age of Onset  . Diabetes Other   . Asthma Other   . Hypertension Other     Social History   Tobacco Use  . Smoking status: Former Smoker    Types: Cigars  . Smokeless tobacco: Never Used  . Tobacco comment: 2-3 Black n mild daily   Substance  Use Topics  . Alcohol use: No  . Drug use: Yes    Types: Marijuana    Comment: last smoked June 2020    Allergies: No Known Allergies  Medications Prior to Admission  Medication Sig Dispense Refill Last Dose  . clindamycin (CLEOCIN) 300 MG capsule Take 1 capsule (300 mg total) by mouth 4 (four) times daily for 7 days. For seven days 28 capsule 0 03/12/2019 at 1200  . ferrous sulfate (FERROUSUL) 325 (65 FE) MG tablet Take 1 tablet (325 mg total) by mouth daily with breakfast. 60 tablet 1 03/12/2019 at 0530  . Prenatal MV-Min-FA-Omega-3 (PRENATAL GUMMIES/DHA & FA) 0.4-32.5 MG CHEW Chew by mouth.   03/12/2019 at 0530  . acetaminophen (TYLENOL) 325 MG tablet Take 650 mg by mouth every 6 (six) hours as needed.     . Blood Pressure Monitor KIT 1 each by Does not apply route daily. 1 each 0   . ondansetron (ZOFRAN ODT) 4 MG disintegrating tablet Take 1 tablet (4 mg total) by mouth every 6 (six) hours as needed for nausea or vomiting. (Patient not taking: Reported on 12/10/2018) 30 tablet 0   . terconazole (TERAZOL 3) 0.8 % vaginal cream Place 1  applicator vaginally at bedtime. (Patient not taking: Reported on 12/10/2018) 20 g 0     Review of Systems  Constitutional: Negative for chills, fatigue and fever.  Eyes: Negative for photophobia.  Respiratory: Positive for shortness of breath. Negative for cough and chest tightness.   Cardiovascular: Negative for chest pain and palpitations.  Gastrointestinal: Negative for abdominal pain.  Genitourinary: Negative for difficulty urinating, flank pain, vaginal bleeding, vaginal discharge and vaginal pain.  Musculoskeletal: Negative for back pain.  Neurological: Positive for dizziness and headaches. Negative for syncope and weakness.  All other systems reviewed and are negative.  Physical Exam   Blood pressure 110/72, pulse (!) 104, temperature 98.8 F (37.1 C), temperature source Oral, resp. rate 16, weight 67.8 kg, last menstrual period 08/01/2018, SpO2  100 %.  Physical Exam  Nursing note and vitals reviewed. Constitutional: She is oriented to person, place, and time. She appears well-developed and well-nourished.  Cardiovascular: Normal rate.  Respiratory: Effort normal and breath sounds normal. No respiratory distress. She has no wheezes. She has no rales. She exhibits no tenderness.  GI: She exhibits no distension. There is no rebound and no guarding.  Gravid  Neurological: She is alert and oriented to person, place, and time.  Skin: Skin is warm and dry.  Psychiatric: She has a normal mood and affect. Her behavior is normal. Judgment and thought content normal.    MAU Course/MDM  Procedures  --Negligible change in Hgb from previous assessment --Patient shows no signs of SOB at rest or when ambulating in unit, bending over to pick up belongings or when repositioned for exam. --Reactive tracing: baseline 155, moderate variability, positive accels, no decels --[place]: quiet --Patient sleeping at 1845.  --Discussed with patient that Fe supplement does not work immediately. Reviewed symptoms of hemodilution in early third trimester and symptom management including more frequent, protein-based snacks throughout the day as previously advised, increasing water intake, compression socks, elevating feet when at rest, slower position changes especially when moving from seated to standing.  Patient Vitals for the past 24 hrs:  BP Temp Temp src Pulse Resp SpO2 Weight  03/12/19 1908 (!) 104/58 - - 76 - - -  03/12/19 1713 110/72 98.8 F (37.1 C) Oral (!) 104 16 100 % 67.8 kg  03/12/19 1712 - - - - - 100 % -   Results for orders placed or performed during the hospital encounter of 03/12/19 (from the past 24 hour(s))  Urinalysis, Routine w reflex microscopic     Status: Abnormal   Collection Time: 03/12/19  5:10 PM  Result Value Ref Range   Color, Urine YELLOW YELLOW   APPearance CLOUDY (A) CLEAR   Specific Gravity, Urine 1.015 1.005 - 1.030    pH 8.0 5.0 - 8.0   Glucose, UA NEGATIVE NEGATIVE mg/dL   Hgb urine dipstick NEGATIVE NEGATIVE   Bilirubin Urine NEGATIVE NEGATIVE   Ketones, ur NEGATIVE NEGATIVE mg/dL   Protein, ur NEGATIVE NEGATIVE mg/dL   Nitrite NEGATIVE NEGATIVE   Leukocytes,Ua NEGATIVE NEGATIVE  Rapid urine drug screen (hospital performed)     Status: None   Collection Time: 03/12/19  5:10 PM  Result Value Ref Range   Opiates NONE DETECTED NONE DETECTED   Cocaine NONE DETECTED NONE DETECTED   Benzodiazepines NONE DETECTED NONE DETECTED   Amphetamines NONE DETECTED NONE DETECTED   Tetrahydrocannabinol NONE DETECTED NONE DETECTED   Barbiturates NONE DETECTED NONE DETECTED  CBC with Differential/Platelet     Status: Abnormal   Collection Time: 03/12/19  5:55 PM  Result Value Ref Range   WBC 10.7 (H) 4.0 - 10.5 K/uL   RBC 3.81 (L) 3.87 - 5.11 MIL/uL   Hemoglobin 10.3 (L) 12.0 - 15.0 g/dL   HCT 32.8 (L) 36.0 - 46.0 %   MCV 86.1 80.0 - 100.0 fL   MCH 27.0 26.0 - 34.0 pg   MCHC 31.4 30.0 - 36.0 g/dL   RDW 12.6 11.5 - 15.5 %   Platelets 319 150 - 400 K/uL   nRBC 0.0 0.0 - 0.2 %   Neutrophils Relative % 66 %   Neutro Abs 7.0 1.7 - 7.7 K/uL   Lymphocytes Relative 23 %   Lymphs Abs 2.4 0.7 - 4.0 K/uL   Monocytes Relative 10 %   Monocytes Absolute 1.0 0.1 - 1.0 K/uL   Eosinophils Relative 0 %   Eosinophils Absolute 0.0 0.0 - 0.5 K/uL   Basophils Relative 0 %   Basophils Absolute 0.0 0.0 - 0.1 K/uL   Immature Granulocytes 1 %   Abs Immature Granulocytes 0.12 (H) 0.00 - 0.07 K/uL   Assessment and Plan  --18 y.o. G1P0000 at [redacted]w[redacted]d --Reactive tracing --Headache resolved with Tylenol --Continue new rx Ferrousul as prescribed yesterday in clinic --Discharge home in stable condition  F/U: Next ob appt 04/12/19 [place] CNM 03/12/2019, 7:28 PM

## 2019-03-12 NOTE — Discharge Instructions (Signed)

## 2019-03-12 NOTE — MAU Note (Signed)
Been experiencing lightheaded, dizziness and headaches(has taken Tylenol, works but then returns) for the past wk.  Got worse today and got sent home from work, couldn't stand.  Has been short of breath.  Called office, before opening, talked to on call nurse, was told to get seen, the office closed at 12 today, so she came here.  Denies visual changes, epigastric pain or increase in swelling.

## 2019-03-12 NOTE — Telephone Encounter (Signed)
Return call to pt regarding feeling lightheaded and dizzy Pt not ava left detailed message for pt to return call and report to the hospital. Pt called on 03-08-19 w/ same symptoms Pt was advised if sx's returned to for to hospital for evaluation.

## 2019-04-12 ENCOUNTER — Other Ambulatory Visit: Payer: Self-pay

## 2019-04-12 ENCOUNTER — Ambulatory Visit (INDEPENDENT_AMBULATORY_CARE_PROVIDER_SITE_OTHER): Payer: Medicaid Other | Admitting: Advanced Practice Midwife

## 2019-04-12 ENCOUNTER — Other Ambulatory Visit (HOSPITAL_COMMUNITY)
Admission: RE | Admit: 2019-04-12 | Discharge: 2019-04-12 | Disposition: A | Discharge status: Home / Self Care | Payer: Medicaid Other | Source: Ambulatory Visit | Attending: Advanced Practice Midwife | Admitting: Advanced Practice Midwife

## 2019-04-12 VITALS — BP 118/67 | HR 55 | Wt 164.8 lb

## 2019-04-12 DIAGNOSIS — Z3400 Encounter for supervision of normal first pregnancy, unspecified trimester: Secondary | ICD-10-CM

## 2019-04-12 DIAGNOSIS — O26893 Other specified pregnancy related conditions, third trimester: Secondary | ICD-10-CM

## 2019-04-12 DIAGNOSIS — N949 Unspecified condition associated with female genital organs and menstrual cycle: Secondary | ICD-10-CM

## 2019-04-12 DIAGNOSIS — Z3A36 36 weeks gestation of pregnancy: Secondary | ICD-10-CM

## 2019-04-12 DIAGNOSIS — O1203 Gestational edema, third trimester: Secondary | ICD-10-CM

## 2019-04-12 NOTE — Progress Notes (Signed)
   PRENATAL VISIT NOTE  Subjective:  Mallory Mcintyre is a 18 y.o. G1P0000 at [redacted]w[redacted]d being seen today for ongoing prenatal care.  She is currently monitored for the following issues for this low-risk pregnancy and has Scoliosis; Encounter for supervision of normal pregnancy, antepartum; Marijuana use; Alpha thalassemia silent carrier; and Anemia affecting pregnancy in third trimester on their problem list.  Patient reports intermittent pelvic pressure, 3-5 times per day.  Contractions: Not present. Vag. Bleeding: None.  Movement: Present. Denies leaking of fluid.   The following portions of the patient's history were reviewed and updated as appropriate: allergies, current medications, past family history, past medical history, past social history, past surgical history and problem list.   Objective:   Vitals:   04/12/19 0912  BP: 118/67  Pulse: (!) 55  Weight: 164 lb 12.8 oz (74.8 kg)    Fetal Status: Fetal Heart Rate (bpm): 136   Movement: Present     General:  Alert, oriented and cooperative. Patient is in no acute distress.  Skin: Skin is warm and dry. No rash noted.   Cardiovascular: Normal heart rate noted  Respiratory: Normal respiratory effort, no problems with respiration noted  Abdomen: Soft, gravid, appropriate for gestational age.  Pain/Pressure: Present     Pelvic: Cervical exam deferred        Extremities: Normal range of motion.  Edema: Mild pitting, slight indentation  Mental Status: Normal mood and affect. Normal behavior. Normal judgment and thought content.   Assessment and Plan:  Pregnancy: G1P0000 at [redacted]w[redacted]d  1. Supervision of normal first pregnancy, antepartum --Anticipatory guidance about next visits/weeks of pregnancy given. --Next visit at 38 weeks, virtual, then 39 weeks virtual, 40 weeks in office --Signs of labor, reasons to seek care reviewed  2. Pelvic pressure in pregnancy, antepartum, third trimester --Pressure is intermittent, located in suprapubic area.  Pt with UTI x 1 during pregnancy so is concerned about this. --UA today to evaluate but discomfort c/w 36 weeks of pregnancy, discussed with pt and questions answered. --Pressure intermittent and infrequent, a few times per day so no evidence of labor.  3. Edema during pregnancy in third trimester --2+ pitting edema BLE.  Skin taut, shiny from ankles up to calves.  Pt reports pain with swelling.  BP grossly normal today.  No s/sx of PEC.   --Verbal and printed teaching about edema given including elevation, compression socks, decreased salt intake. --Pt to take BP weekly or more often if possible and report any elevated BP right away. -PEC s/sx reviewed  Preterm labor symptoms and general obstetric precautions including but not limited to vaginal bleeding, contractions, leaking of fluid and fetal movement were reviewed in detail with the patient. Please refer to After Visit Summary for other counseling recommendations.   Return in about 2 weeks (around 04/26/2019).  Future Appointments  Date Time Provider [place]  04/26/2019  9:30 AM Shelly Bombard, MD [place] None  05/03/2019  9:15 AM Shelly Bombard, MD [facility] None  05/10/2019  8:15 AM Leftwich-Kirby, Kathie Dike, CNM [facility] None    Fatima Blank, CNM

## 2019-04-12 NOTE — Progress Notes (Signed)
ROB/GBS.  C/o swollen feet, ankles and legs, she props them but it does not help.  She was standing for extended periods. She also grinds her teeth, ha 4/10 x 2 weeks.

## 2019-04-12 NOTE — Patient Instructions (Addendum)
Labor Precautions Reasons to come to MAU at [facility] and [place]:  1.  Contractions are  5 minutes apart or less, each last 1 minute, these have been going on for 1-2 hours, and you cannot walk or talk during them 2.  You have a large gush of fluid, or a trickle of fluid that will not stop and you have to wear a pad 3.  You have bleeding that is bright red, heavier than spotting--like menstrual bleeding (spotting can be normal in early labor or after a check of your cervix) 4.  You do not feel the baby moving like he/she normally does   AFTER 37 weeks you may try the [employer] at [email or web address] to help the baby get into position for labor.  Also see below for tips to get your cervix ready:  Cervical Ripening: May try one or both  Red Raspberry Leaf capsules:  two 300mg  or 400mg  tablets with each meal, 2-3 times a day  Potential Side Effects Of Raspberry Leaf:  Most women do not experience any side effects from drinking raspberry leaf tea. However, nausea and loose stools are possible     Evening Primrose Oil capsules: may take 1 to 3 capsules daily. May also prick one to release the oil and insert it into your vagina at night.  Some of the potential side effects:  Upset stomach  Loose stools or diarrhea  Headaches  Nausea   Peripheral Edema  Peripheral edema is swelling that is caused by a buildup of fluid. Peripheral edema most often affects the lower legs, ankles, and feet. It can also develop in the arms, hands, and face. The area of the body that has peripheral edema will look swollen. It may also feel heavy or warm. Your clothes may start to feel tight. Pressing on the area may make a temporary dent in your skin. You may not be able to move your swollen arm or leg as much as usual. There are many causes of peripheral edema. It can happen because of a complication of other conditions such as congestive heart failure, kidney disease, or a problem with  your blood circulation. It also can be a side effect of certain medicines or because of an infection. It often happens to women during pregnancy. Sometimes, the cause is not known. Follow these instructions at home: Managing pain, stiffness, and swelling   Raise (elevate) your legs while you are sitting or lying down.  Move around often to prevent stiffness and to lessen swelling.  Do not sit or stand for long periods of time.  Wear support stockings as told by your health care provider. Medicines  Take over-the-counter and prescription medicines only as told by your health care provider.  Your health care provider may prescribe medicine to help your body get rid of excess water (diuretic). General instructions  Pay attention to any changes in your symptoms.  Follow instructions from your health care provider about limiting salt (sodium) in your diet. Sometimes, eating less salt may reduce swelling.  Moisturize skin daily to help prevent skin from cracking and draining.  Keep all follow-up visits as told by your health care provider. This is important. Contact a health care provider if you have:  A fever.  Edema that starts suddenly or is getting worse, especially if you are pregnant or have a medical condition.  Swelling in only one leg.  Increased swelling, redness, or pain in one or both of your legs.  Drainage  or sores at the area where you have edema. Get help right away if you:  Develop shortness of breath, especially when you are lying down.  Have pain in your chest or abdomen.  Feel weak.  Feel faint. Summary  Peripheral edema is swelling that is caused by a buildup of fluid. Peripheral edema most often affects the lower legs, ankles, and feet.  Move around often to prevent stiffness and to lessen swelling. Do not sit or stand for long periods of time.  Pay attention to any changes in your symptoms.  Contact a health care provider if you have edema that  starts suddenly or is getting worse, especially if you are pregnant or have a medical condition.  Get help right away if you develop shortness of breath, especially when lying down. This information is not intended to replace advice given to you by your health care provider. Make sure you discuss any questions you have with your health care provider. Document Released: 09/05/2004 Document Revised: 04/22/2018 Document Reviewed: 04/22/2018 Elsevier Patient Education  2020 [employer]

## 2019-04-13 LAB — CERVICOVAGINAL ANCILLARY ONLY
Chlamydia: NEGATIVE
Neisseria Gonorrhea: NEGATIVE

## 2019-04-14 LAB — STREP GP B NAA: Strep Gp B NAA: POSITIVE — AB

## 2019-04-15 ENCOUNTER — Encounter: Payer: Self-pay | Admitting: Advanced Practice Midwife

## 2019-04-15 DIAGNOSIS — O99820 Streptococcus B carrier state complicating pregnancy: Secondary | ICD-10-CM | POA: Insufficient documentation

## 2019-04-15 LAB — URINE CULTURE, OB REFLEX

## 2019-04-15 LAB — CULTURE, OB URINE

## 2019-04-26 ENCOUNTER — Ambulatory Visit (INDEPENDENT_AMBULATORY_CARE_PROVIDER_SITE_OTHER): Payer: Medicaid Other | Admitting: Obstetrics

## 2019-04-26 ENCOUNTER — Encounter: Payer: Self-pay | Admitting: Obstetrics

## 2019-04-26 ENCOUNTER — Other Ambulatory Visit: Payer: Self-pay

## 2019-04-26 VITALS — BP 110/87 | HR 82

## 2019-04-26 DIAGNOSIS — Z3A38 38 weeks gestation of pregnancy: Secondary | ICD-10-CM

## 2019-04-26 DIAGNOSIS — K5901 Slow transit constipation: Secondary | ICD-10-CM

## 2019-04-26 DIAGNOSIS — Z3400 Encounter for supervision of normal first pregnancy, unspecified trimester: Secondary | ICD-10-CM

## 2019-04-26 DIAGNOSIS — Z3403 Encounter for supervision of normal first pregnancy, third trimester: Secondary | ICD-10-CM

## 2019-04-26 MED ORDER — DOCUSATE SODIUM 100 MG PO CAPS: 100.0000 mg | ORAL_CAPSULE | Freq: Two times a day (BID) | ORAL | 5 refills | Status: DC

## 2019-04-26 NOTE — Progress Notes (Signed)
   [place] VIRTUAL VIDEO VISIT ENCOUNTER NOTE  Provider location: [facility] [place] [place]   I connected with Mallory Mcintyre on 04/26/19 at  9:30 AM EDT by WebEx OB Video Encounter at home and verified that I am speaking with the correct person using two identifiers.   I discussed the limitations, risks, security and privacy concerns of performing an evaluation and management service virtually and the availability of in person appointments. I also discussed with the patient that there may be a patient responsible charge related to this service. The patient expressed understanding and agreed to proceed. Subjective:  Mallory Mcintyre is a 18 y.o. G1P0000 at [redacted]w[redacted]d being seen today for ongoing prenatal care.  She is currently monitored for the following issues for this low-risk pregnancy and has Scoliosis; Encounter for supervision of normal pregnancy, antepartum; Marijuana use; Alpha thalassemia silent carrier; Anemia affecting pregnancy in third trimester; and GBS (group B Streptococcus carrier), +RV culture, currently pregnant on their problem list.  Patient reports no complaints.  Contractions: Irregular. Vag. Bleeding: None.  Movement: Present. Denies any leaking of fluid.   The following portions of the patient's history were reviewed and updated as appropriate: allergies, current medications, past family history, past medical history, past social history, past surgical history and problem list.   Objective:   Vitals:   04/26/19 0856  BP: 110/87  Pulse: 82    Fetal Status:     Movement: Present     General:  Alert, oriented and cooperative. Patient is in no acute distress.  Respiratory: Normal respiratory effort, no problems with respiration noted  Mental Status: Normal mood and affect. Normal behavior. Normal judgment and thought content.  Rest of physical exam deferred due to type of encounter  Imaging: No results found.  Assessment and Plan:   Pregnancy: G1P0000 at [redacted]w[redacted]d 1. Supervision of normal first pregnancy, antepartum   Term labor symptoms and general obstetric precautions including but not limited to vaginal bleeding, contractions, leaking of fluid and fetal movement were reviewed in detail with the patient. I discussed the assessment and treatment plan with the patient. The patient was provided an opportunity to ask questions and all were answered. The patient agreed with the plan and demonstrated an understanding of the instructions. The patient was advised to call back or seek an in-person office evaluation/go to MAU at [facility] for any urgent or concerning symptoms. Please refer to After Visit Summary for other counseling recommendations.   I provided 10 minutes of face-to-face time during this encounter.  Return in about 1 week (around 05/03/2019) for ROB in person.  Future Appointments  Date Time Provider [place]  04/26/2019  9:30 AM Shelly Bombard, MD [place] None  05/03/2019  9:15 AM Shelly Bombard, MD [place] None  05/10/2019  8:15 AM Leftwich-Kirby, Kathie Dike, CNM [facility] None    Baltazar Najjar, [place] for [facility], [place] [facility] 04-26-2019

## 2019-04-26 NOTE — Progress Notes (Signed)
S/w patient for webex visit. Pt reports fetal movement with irregular contractions. Pt reports +1 pitting edema in feet.

## 2019-04-26 NOTE — Addendum Note (Signed)
Addended by: Baltazar Najjar A on: 04/26/2019 10:56 AM   Modules accepted: Orders

## 2019-05-03 ENCOUNTER — Encounter: Payer: Self-pay | Admitting: Obstetrics

## 2019-05-03 ENCOUNTER — Other Ambulatory Visit: Payer: Self-pay

## 2019-05-03 ENCOUNTER — Ambulatory Visit (INDEPENDENT_AMBULATORY_CARE_PROVIDER_SITE_OTHER): Payer: Medicaid Other | Admitting: Advanced Practice Midwife

## 2019-05-03 ENCOUNTER — Encounter: Payer: Medicaid Other | Admitting: Obstetrics

## 2019-05-03 VITALS — BP 132/82 | HR 72 | Wt 169.0 lb

## 2019-05-03 DIAGNOSIS — Z3A39 39 weeks gestation of pregnancy: Secondary | ICD-10-CM

## 2019-05-03 DIAGNOSIS — O99713 Diseases of the skin and subcutaneous tissue complicating pregnancy, third trimester: Secondary | ICD-10-CM

## 2019-05-03 DIAGNOSIS — L700 Acne vulgaris: Secondary | ICD-10-CM

## 2019-05-03 DIAGNOSIS — Z3400 Encounter for supervision of normal first pregnancy, unspecified trimester: Secondary | ICD-10-CM

## 2019-05-03 MED ORDER — CLINDAMYCIN PHOS-BENZOYL PEROX 1-5 % EX GEL: Freq: Two times a day (BID) | CUTANEOUS | 0 refills | Status: DC

## 2019-05-03 NOTE — Progress Notes (Signed)
   PRENATAL VISIT NOTE  Subjective:  Mallory Mcintyre is a 18 y.o. G1P0000 at [redacted]w[redacted]d being seen today for ongoing prenatal care.  She is currently monitored for the following issues for this low-risk pregnancy and has Scoliosis; Encounter for supervision of normal pregnancy, antepartum; Marijuana use; Alpha thalassemia silent carrier; Anemia affecting pregnancy in third trimester; and GBS (group B Streptococcus carrier), +RV culture, currently pregnant on their problem list.  Patient reports occasional contractions.  Contractions: Irregular.  .  Movement: Present. Denies leaking of fluid.   The following portions of the patient's history were reviewed and updated as appropriate: allergies, current medications, past family history, past medical history, past social history, past surgical history and problem list.   Objective:   Vitals:   05/03/19 1056  BP: 132/82  Pulse: 72  Weight: 169 lb (76.7 kg)    Fetal Status: Fetal Heart Rate (bpm): 144 Fundal Height: 38 cm Movement: Present  Presentation: Vertex  General:  Alert, oriented and cooperative. Patient is in no acute distress.  Skin: Skin is warm and dry. No rash noted.   Cardiovascular: Normal heart rate noted  Respiratory: Normal respiratory effort, no problems with respiration noted  Abdomen: Soft, gravid, appropriate for gestational age.  Pain/Pressure: Present     Pelvic: Cervical exam performed Dilation: 3 Effacement (%): 50 Station: -2  Extremities: Normal range of motion.  Edema: Trace  Mental Status: Normal mood and affect. Normal behavior. Normal judgment and thought content.   Assessment and Plan:  Pregnancy: G1P0000 at [redacted]w[redacted]d 1. Pregnancy-related exacerbation of dermatosis, third trimester   2. Acne vulgaris --Pt has tried acne face wash and witch hazel which usually work for her acne but it is worsening with the pregnancy. - clindamycin-benzoyl peroxide (BENZACLIN) gel; Apply topically 2 (two) times daily.  Dispense: 25 g;  Refill: 0  3. Supervision of normal first pregnancy, antepartum --Anticipatory guidance about next visits/weeks of pregnancy given. --Cervix 3/50/-2. Pt is using evening primrose oil, raspberry leaf tea.  Membranes swept today, scant spotting on glove. Bleeding precautions/reasons to seek care reviewed.  Labor precautions reviewed. Pt to try the [place] for improved fetal positioning. --Next visit in office at 40 weeks with NST and to schedule IOL at 41 weeks  Term labor symptoms and general obstetric precautions including but not limited to vaginal bleeding, contractions, leaking of fluid and fetal movement were reviewed in detail with the patient. Please refer to After Visit Summary for other counseling recommendations.   Return in about 1 week (around 05/10/2019).  Future Appointments  Date Time Provider [place]  05/10/2019  8:15 AM Leftwich-Kirby, Kathie Dike, CNM [facility] None    Fatima Blank, CNM

## 2019-05-10 ENCOUNTER — Other Ambulatory Visit: Payer: Self-pay

## 2019-05-10 ENCOUNTER — Ambulatory Visit (INDEPENDENT_AMBULATORY_CARE_PROVIDER_SITE_OTHER): Payer: Medicaid Other | Admitting: Advanced Practice Midwife

## 2019-05-10 ENCOUNTER — Telehealth (HOSPITAL_COMMUNITY): Payer: Self-pay | Admitting: *Deleted

## 2019-05-10 VITALS — BP 124/80 | HR 57 | Wt 170.0 lb

## 2019-05-10 DIAGNOSIS — Z3A40 40 weeks gestation of pregnancy: Secondary | ICD-10-CM | POA: Diagnosis not present

## 2019-05-10 DIAGNOSIS — Z3400 Encounter for supervision of normal first pregnancy, unspecified trimester: Secondary | ICD-10-CM

## 2019-05-10 DIAGNOSIS — O480 Post-term pregnancy: Secondary | ICD-10-CM | POA: Diagnosis not present

## 2019-05-10 DIAGNOSIS — O26893 Other specified pregnancy related conditions, third trimester: Secondary | ICD-10-CM

## 2019-05-10 DIAGNOSIS — L700 Acne vulgaris: Secondary | ICD-10-CM

## 2019-05-10 NOTE — Addendum Note (Signed)
Addended by: Fatima Blank A on: 05/10/2019 03:04 PM   Modules accepted: Orders, SmartSet

## 2019-05-10 NOTE — Progress Notes (Signed)
   PRENATAL VISIT NOTE  Subjective:  Mallory Mcintyre is a 18 y.o. G1P0000 at [redacted]w[redacted]d being seen today for ongoing prenatal care.  She is currently monitored for the following issues for this low-risk pregnancy and has Scoliosis; Encounter for supervision of normal pregnancy, antepartum; Marijuana use; Alpha thalassemia silent carrier; Anemia affecting pregnancy in third trimester; and GBS (group B Streptococcus carrier), +RV culture, currently pregnant on their problem list.  Patient reports occasional contractions.  Contractions: Irregular. Vag. Bleeding: None.  Movement: Present. Denies leaking of fluid.   The following portions of the patient's history were reviewed and updated as appropriate: allergies, current medications, past family history, past medical history, past social history, past surgical history and problem list.   Objective:   Vitals:   05/10/19 0825  BP: 124/80  Pulse: (!) 57  Weight: 170 lb (77.1 kg)    Fetal Status: Fetal Heart Rate (bpm): NST-R Fundal Height: 38 cm Movement: Present  Presentation: Vertex  General:  Alert, oriented and cooperative. Patient is in no acute distress.  Skin: Skin is warm and dry. No rash noted.   Cardiovascular: Normal heart rate noted  Respiratory: Normal respiratory effort, no problems with respiration noted  Abdomen: Soft, gravid, appropriate for gestational age.  Pain/Pressure: Present     Pelvic: Cervical exam performed Dilation: 4 Effacement (%): 70 Station: -2  Extremities: Normal range of motion.     Mental Status: Normal mood and affect. Normal behavior. Normal judgment and thought content.   Assessment and Plan:  Pregnancy: G1P0000 at [redacted]w[redacted]d 1. Supervision of normal first pregnancy, antepartum --Anticipatory guidance about next visits/weeks of pregnancy given. --Pt to try the [employer] today and daily to improve fetal position/encourage labor onset. Pt is doing evening primrose oil and raspberry leaf tea. --NST reactive  today at 40 weeks, IOL for 41 weeks on 05/15/19. --Cervix 4/70/-2, vertex today --Kick counts reviewed  2. Acne vulgaris --Rx for benzoyl peroxide and clindamycin cream not covered by insurance. Pt to use OTC benzoyl peroxide wash/creams PRN.    Term labor symptoms and general obstetric precautions including but not limited to vaginal bleeding, contractions, leaking of fluid and fetal movement were reviewed in detail with the patient. Please refer to After Visit Summary for other counseling recommendations.   No follow-ups on file.  Future Appointments  Date Time Provider [place]  05/15/2019 12:00 AM [facility] [place] None    Fatima Blank, CNM

## 2019-05-10 NOTE — Addendum Note (Signed)
Addended by: Lewie Loron D on: 05/10/2019 11:58 AM   Modules accepted: Orders

## 2019-05-10 NOTE — Telephone Encounter (Signed)
Preadmission screen  

## 2019-05-10 NOTE — Patient Instructions (Signed)
Labor Precautions Reasons to come to MAU at [facility]:  1.  Contractions are  5 minutes apart or less, each last 1 minute, these have been going on for 1-2 hours, and you cannot walk or talk during them 2.  You have a large gush of fluid, or a trickle of fluid that will not stop and you have to wear a pad 3.  You have bleeding that is bright red, heavier than spotting--like menstrual bleeding (spotting can be normal in early labor or after a check of your cervix) 4.  You do not feel the baby moving like he/she normally does  

## 2019-05-11 ENCOUNTER — Telehealth (HOSPITAL_COMMUNITY): Payer: Self-pay | Admitting: *Deleted

## 2019-05-11 ENCOUNTER — Encounter (HOSPITAL_COMMUNITY): Payer: Self-pay | Admitting: *Deleted

## 2019-05-11 NOTE — Telephone Encounter (Signed)
Preadmission screen  

## 2019-05-12 ENCOUNTER — Other Ambulatory Visit: Payer: Self-pay

## 2019-05-12 ENCOUNTER — Inpatient Hospital Stay (HOSPITAL_COMMUNITY)
Admission: AD | Admit: 2019-05-12 | Discharge: 2019-05-14 | DRG: 806 | Disposition: A | Discharge status: Home / Self Care | Payer: Medicaid Other | Attending: Family Medicine | Admitting: Family Medicine

## 2019-05-12 ENCOUNTER — Encounter (HOSPITAL_COMMUNITY): Payer: Self-pay

## 2019-05-12 DIAGNOSIS — O4292 Full-term premature rupture of membranes, unspecified as to length of time between rupture and onset of labor: Secondary | ICD-10-CM | POA: Diagnosis present

## 2019-05-12 DIAGNOSIS — Z3A40 40 weeks gestation of pregnancy: Secondary | ICD-10-CM

## 2019-05-12 DIAGNOSIS — O480 Post-term pregnancy: Secondary | ICD-10-CM | POA: Diagnosis present

## 2019-05-12 DIAGNOSIS — O99324 Drug use complicating childbirth: Secondary | ICD-10-CM | POA: Diagnosis present

## 2019-05-12 DIAGNOSIS — Z20828 Contact with and (suspected) exposure to other viral communicable diseases: Secondary | ICD-10-CM | POA: Diagnosis present

## 2019-05-12 DIAGNOSIS — Z87891 Personal history of nicotine dependence: Secondary | ICD-10-CM | POA: Diagnosis not present

## 2019-05-12 DIAGNOSIS — O4202 Full-term premature rupture of membranes, onset of labor within 24 hours of rupture: Secondary | ICD-10-CM

## 2019-05-12 DIAGNOSIS — O99824 Streptococcus B carrier state complicating childbirth: Secondary | ICD-10-CM | POA: Diagnosis present

## 2019-05-12 DIAGNOSIS — O26893 Other specified pregnancy related conditions, third trimester: Secondary | ICD-10-CM | POA: Diagnosis present

## 2019-05-12 DIAGNOSIS — O99013 Anemia complicating pregnancy, third trimester: Secondary | ICD-10-CM

## 2019-05-12 DIAGNOSIS — D563 Thalassemia minor: Secondary | ICD-10-CM | POA: Diagnosis present

## 2019-05-12 DIAGNOSIS — Z3A41 41 weeks gestation of pregnancy: Secondary | ICD-10-CM | POA: Diagnosis present

## 2019-05-12 DIAGNOSIS — F1290 Cannabis use, unspecified, uncomplicated: Secondary | ICD-10-CM | POA: Diagnosis present

## 2019-05-12 DIAGNOSIS — M419 Scoliosis, unspecified: Secondary | ICD-10-CM | POA: Diagnosis present

## 2019-05-12 DIAGNOSIS — O99820 Streptococcus B carrier state complicating pregnancy: Secondary | ICD-10-CM

## 2019-05-12 LAB — CBC
HCT: 39.3 % (ref 36.0–46.0)
Hemoglobin: 12.7 g/dL (ref 12.0–15.0)
MCH: 28.3 pg (ref 26.0–34.0)
MCHC: 32.3 g/dL (ref 30.0–36.0)
MCV: 87.7 fL (ref 80.0–100.0)
Platelets: 289 10*3/uL (ref 150–400)
RBC: 4.48 MIL/uL (ref 3.87–5.11)
RDW: 14.6 % (ref 11.5–15.5)
WBC: 10.7 10*3/uL — ABNORMAL HIGH (ref 4.0–10.5)
nRBC: 0 % (ref 0.0–0.2)

## 2019-05-12 LAB — SARS CORONAVIRUS 2 BY RT PCR (HOSPITAL ORDER, PERFORMED IN ~~LOC~~ HOSPITAL LAB): SARS Coronavirus 2: NEGATIVE

## 2019-05-12 LAB — TYPE AND SCREEN
ABO/RH(D): B POS
Antibody Screen: NEGATIVE

## 2019-05-12 LAB — ABO/RH: ABO/RH(D): B POS

## 2019-05-12 LAB — POCT FERN TEST: POCT Fern Test: POSITIVE

## 2019-05-12 MED ORDER — ONDANSETRON HCL 4 MG/2ML IJ SOLN: 4.0000 mg | INTRAMUSCULAR | Status: DC | PRN

## 2019-05-12 MED ORDER — PHENYLEPHRINE 40 MCG/ML (10ML) SYRINGE FOR IV PUSH (FOR BLOOD PRESSURE SUPPORT): 80.0000 ug | PREFILLED_SYRINGE | INTRAVENOUS | Status: DC | PRN

## 2019-05-12 MED ORDER — ONDANSETRON HCL 4 MG PO TABS: 4.0000 mg | ORAL_TABLET | ORAL | Status: DC | PRN

## 2019-05-12 MED ORDER — LACTATED RINGERS IV SOLN
INTRAVENOUS | Status: DC
  Administered 2019-05-12 (×2): via INTRAVENOUS

## 2019-05-12 MED ORDER — DIPHENHYDRAMINE HCL 25 MG PO CAPS: 25.0000 mg | ORAL_CAPSULE | Freq: Four times a day (QID) | ORAL | Status: DC | PRN

## 2019-05-12 MED ORDER — DIBUCAINE (PERIANAL) 1 % EX OINT: 1.0000 "application " | TOPICAL_OINTMENT | CUTANEOUS | Status: DC | PRN

## 2019-05-12 MED ORDER — OXYTOCIN 40 UNITS IN NORMAL SALINE INFUSION - SIMPLE MED
2.5000 [IU]/h | INTRAVENOUS | Status: DC
  Administered 2019-05-12: 2.5 [IU]/h via INTRAVENOUS
  Filled 2019-05-12: qty 1000

## 2019-05-12 MED ORDER — ACETAMINOPHEN 325 MG PO TABS
650.0000 mg | ORAL_TABLET | ORAL | Status: DC | PRN
  Administered 2019-05-13: 650 mg via ORAL
  Filled 2019-05-12: qty 2

## 2019-05-12 MED ORDER — PRENATAL MULTIVITAMIN CH
1.0000 | ORAL_TABLET | Freq: Every day | ORAL | Status: DC
  Administered 2019-05-13 – 2019-05-14 (×2): 1 via ORAL
  Filled 2019-05-12 (×2): qty 1

## 2019-05-12 MED ORDER — SOD CITRATE-CITRIC ACID 500-334 MG/5ML PO SOLN: 30.0000 mL | ORAL | Status: DC | PRN

## 2019-05-12 MED ORDER — SODIUM CHLORIDE 0.9 % IV SOLN
5.0000 10*6.[IU] | Freq: Once | INTRAVENOUS | Status: AC
  Administered 2019-05-12: 5 10*6.[IU] via INTRAVENOUS
  Filled 2019-05-12: qty 5

## 2019-05-12 MED ORDER — FENTANYL CITRATE (PF) 100 MCG/2ML IJ SOLN
100.0000 ug | INTRAMUSCULAR | Status: DC | PRN
  Administered 2019-05-12 (×2): 100 ug via INTRAVENOUS
  Filled 2019-05-12 (×2): qty 2

## 2019-05-12 MED ORDER — FENTANYL CITRATE (PF) 100 MCG/2ML IJ SOLN
INTRAMUSCULAR | Status: AC
  Filled 2019-05-12: qty 2

## 2019-05-12 MED ORDER — OXYCODONE-ACETAMINOPHEN 5-325 MG PO TABS: 2.0000 | ORAL_TABLET | ORAL | Status: DC | PRN

## 2019-05-12 MED ORDER — EPHEDRINE 5 MG/ML INJ: 10.0000 mg | INTRAVENOUS | Status: DC | PRN

## 2019-05-12 MED ORDER — LACTATED RINGERS IV SOLN: 500.0000 mL | Freq: Once | INTRAVENOUS | Status: DC

## 2019-05-12 MED ORDER — SENNOSIDES-DOCUSATE SODIUM 8.6-50 MG PO TABS
2.0000 | ORAL_TABLET | ORAL | Status: DC
  Administered 2019-05-12 – 2019-05-13 (×2): 2 via ORAL
  Filled 2019-05-12 (×2): qty 2

## 2019-05-12 MED ORDER — LIDOCAINE HCL (PF) 1 % IJ SOLN
30.0000 mL | INTRAMUSCULAR | Status: AC | PRN
  Administered 2019-05-12: 30 mL via SUBCUTANEOUS
  Filled 2019-05-12: qty 30

## 2019-05-12 MED ORDER — FENTANYL-BUPIVACAINE-NACL 0.5-0.125-0.9 MG/250ML-% EP SOLN: 12.0000 mL/h | EPIDURAL | Status: DC | PRN

## 2019-05-12 MED ORDER — DIPHENHYDRAMINE HCL 50 MG/ML IJ SOLN: 12.5000 mg | INTRAMUSCULAR | Status: DC | PRN

## 2019-05-12 MED ORDER — PENICILLIN G 3 MILLION UNITS IVPB - SIMPLE MED
3.0000 10*6.[IU] | INTRAVENOUS | Status: DC
  Administered 2019-05-12: 07:00:00 3 10*6.[IU] via INTRAVENOUS
  Filled 2019-05-12: qty 100

## 2019-05-12 MED ORDER — COCONUT OIL OIL
1.0000 "application " | TOPICAL_OIL | Status: DC | PRN
  Administered 2019-05-13: 1 via TOPICAL

## 2019-05-12 MED ORDER — LACTATED RINGERS IV SOLN: 500.0000 mL | INTRAVENOUS | Status: DC | PRN

## 2019-05-12 MED ORDER — ZOLPIDEM TARTRATE 5 MG PO TABS: 5.0000 mg | ORAL_TABLET | Freq: Every evening | ORAL | Status: DC | PRN

## 2019-05-12 MED ORDER — WITCH HAZEL-GLYCERIN EX PADS: 1.0000 "application " | MEDICATED_PAD | CUTANEOUS | Status: DC | PRN

## 2019-05-12 MED ORDER — BENZOCAINE-MENTHOL 20-0.5 % EX AERO
1.0000 "application " | INHALATION_SPRAY | CUTANEOUS | Status: DC | PRN
  Administered 2019-05-12: 1 via TOPICAL
  Filled 2019-05-12: qty 56

## 2019-05-12 MED ORDER — IBUPROFEN 600 MG PO TABS
600.0000 mg | ORAL_TABLET | Freq: Four times a day (QID) | ORAL | Status: DC
  Administered 2019-05-12 – 2019-05-14 (×9): 600 mg via ORAL
  Filled 2019-05-12 (×9): qty 1

## 2019-05-12 MED ORDER — OXYTOCIN BOLUS FROM INFUSION
500.0000 mL | Freq: Once | INTRAVENOUS | Status: AC
  Administered 2019-05-12: 500 mL via INTRAVENOUS

## 2019-05-12 MED ORDER — ONDANSETRON HCL 4 MG/2ML IJ SOLN: 4.0000 mg | Freq: Four times a day (QID) | INTRAMUSCULAR | Status: DC | PRN

## 2019-05-12 MED ORDER — OXYCODONE-ACETAMINOPHEN 5-325 MG PO TABS: 1.0000 | ORAL_TABLET | ORAL | Status: DC | PRN

## 2019-05-12 MED ORDER — TETANUS-DIPHTH-ACELL PERTUSSIS 5-2.5-18.5 LF-MCG/0.5 IM SUSP: 0.5000 mL | Freq: Once | INTRAMUSCULAR | Status: DC

## 2019-05-12 MED ORDER — ACETAMINOPHEN 325 MG PO TABS: 650.0000 mg | ORAL_TABLET | ORAL | Status: DC | PRN

## 2019-05-12 MED ORDER — SIMETHICONE 80 MG PO CHEW: 80.0000 mg | CHEWABLE_TABLET | ORAL | Status: DC | PRN

## 2019-05-12 NOTE — Lactation Note (Signed)
This note was copied from a baby's chart. Lactation Consultation Note  Patient Name: Mallory Mcintyre Date: 05/12/2019 Reason for consult: Follow-up assessment;Term;Primapara;1st time breastfeeding  Baby is 8 hours old  Difficult latch due to semi compressible areolas ,  On the left areola more compressible than the right indicating shells ( MBURN aware to instruct mom ) .  The MBURN started the #20 NS and LC agrees mom needs it due the baby Sucking her upper lip and tongue. The Nipple shield helps her to relax her tongue for latch.  Baby latched with the NS for approx 5- 7 mins no milk in the NS.  LC plan - shells between feedings  Prior to latch - breast massage,hand express, pre-pump with hand pump and apply NS if EBM available instill EBM into the NS .  Feed 8-12 times in 24 hours and with feeding cues .  After feeding post pump both breast for 15 -20 mins and save milk.  Per mom active with WIC / [place] and has a DEBP at  Home.   [place] reassured mom this [place] plan will take some time due to her tissue and baby  Sucking her lip and tongue.     Maternal Data Has patient been taught Hand Expression?: Yes(2 tiny drops - mom reports breast changes with pregnancy) Does the patient have breastfeeding experience prior to this delivery?: No  Feeding Feeding Type: Breast Fed  LATCH Score Latch: Repeated attempts needed to sustain latch, nipple held in mouth throughout feeding, stimulation needed to elicit sucking reflex.  Audible Swallowing: None  Type of Nipple: Flat  Comfort (Breast/Nipple): Soft / non-tender  Hold (Positioning): Assistance needed to correctly position infant at breast and maintain latch.  LATCH Score: 5  Interventions Interventions: Breast feeding basics reviewed;Assisted with latch;Skin to skin;Breast massage;Hand express;Breast compression;Adjust position;Support pillows;Position options;Shells;Hand pump;DEBP  Lactation Tools Discussed/Used Tools:  Pump;Other (comment);Shells(MBURN aware to instruct mom on shells) Shell Type: Inverted Breast pump type: Manual;Other (comment);Double-Electric Breast Pump(MBURN aware to set up the DEBP) WIC Program: Yes   Consult Status Consult Status: Follow-up Date: 05/13/19 Follow-up type: In-patient    [place] 05/12/2019, 7:58 PM

## 2019-05-12 NOTE — Discharge Summary (Signed)
Postpartum Discharge Summary      Patient Name: Mallory Mcintyre DOB: 2000/11/12 MRN: [id number]  Date of admission: 05/12/2019 Delivering Provider: Lajean Manes   Date of discharge: 05/14/2019  Admitting diagnosis: 8 WKS, LEAKING FLUIDS Intrauterine pregnancy: [redacted]w[redacted]d    Secondary diagnosis:  Active Problems:   Post term pregnancy at 494weeks gestation   SVD (spontaneous vaginal delivery)  Additional problems: Marijuana use- cessation on 7/4, anemia affecting pregnancy, GBS carrier, scoliosis, alpha thalassemia silent carrier      Discharge diagnosis: Term Pregnancy Delivered and Anemia                                                                                                Post partum procedures:none  Augmentation: none  Complications: None  Hospital course:  Onset of Labor With Vaginal Delivery     18y.o. yo G1P0000 at 427w4das admitted in Latent Labor on 05/12/2019. Patient had an uncomplicated labor course as follows:  Membrane Rupture Time/Date: 12:15 AM ,05/12/2019   Intrapartum Procedures: Episiotomy: None [1]                                         Lacerations:  Labial [10]  Patient had a delivery of a Viable infant. 05/12/2019  Information for the patient's newborn:  CoCher, Franzoni0[[id number]]Delivery Method: Vag-Spont     Pateint had an uncomplicated postpartum course.  She is ambulating, tolerating a regular diet, passing flatus, and urinating well. Patient is discharged home in stable condition on 05/14/19.  Delivery time: 8:39 AM    Magnesium Sulfate received: No BMZ received: No Rhophylac:N/A MMR:N/A Transfusion:No  Physical exam  Vitals:   05/13/19 0526 05/13/19 1427 05/13/19 2149 05/14/19 0542  BP: (!) 132/93 123/82 130/80 133/86  Pulse: (!) 59 70 68 61  Resp: _0 Temp: 97.8 F (36.6 C) 98.2 F (36.8 C) 98.4 F (36.9 C) 98.1 F (36.7 C)  TempSrc: Oral Oral Oral   SpO2:      Weight:      Height:       General: alert  and cooperative Lochia: appropriate Uterine Fundus: firm Incision: N/A DVT Evaluation: No evidence of DVT seen on physical exam. Labs: Lab Results  Component Value Date   WBC 10.7 (H) 05/12/2019   HGB 12.7 05/12/2019   HCT 39.3 05/12/2019   MCV 87.7 05/12/2019   PLT 289 05/12/2019   CMP Latest Ref Rng & Units 03/12/2019  Glucose 70 - 99 mg/dL 85  BUN 6 - 20 mg/dL <5(L)  Creatinine 0.44 - 1.00 mg/dL 0.53  Sodium 135 - 145 mmol/L 136  Potassium 3.5 - 5.1 mmol/L 3.5  Chloride 98 - 111 mmol/L 106  CO2 22 - 32 mmol/L 22  Calcium 8.9 - 10.3 mg/dL 8.5(L)  Total Protein 6.5 - 8.1 g/dL 6.0(L)  Total Bilirubin 0.3 - 1.2 mg/dL 0.4  Alkaline Phos 38 - 126 U/L 96  AST 15 - 41 U/L 13(L)  ALT 0 - 44 U/L 12    Discharge instruction: per After Visit Summary and "Baby and Me Booklet".  After visit meds:  Allergies as of 05/14/2019   No Known Allergies     Medication List    STOP taking these medications   clindamycin-benzoyl peroxide gel Commonly known as: BENZACLIN   docusate sodium 100 MG capsule Commonly known as: Colace   ferrous sulfate 325 (65 FE) MG tablet Commonly known as: FerrouSul   ondansetron 4 MG disintegrating tablet Commonly known as: Zofran ODT     TAKE these medications   acetaminophen 325 MG tablet Commonly known as: TYLENOL Take 650 mg by mouth every 6 (six) hours as needed.   Blood Pressure Monitor Kit 1 each by Does not apply route daily.   ibuprofen 600 MG tablet Commonly known as: ADVIL Take 1 tablet (600 mg total) by mouth every 6 (six) hours.   senna-docusate 8.6-50 MG tablet Commonly known as: Senokot-S Take 2 tablets by mouth daily. Start taking on: May 15, 2019       Diet: routine diet  Activity: Advance as tolerated. Pelvic rest for 6 weeks.   Outpatient follow up:4 weeks Follow up Appt: Future Appointments  Date Time Provider Department Center  06/16/2019 10:00 AM Nugent, Nicole E, NP [facility] None   Follow up  Visit: Follow-up Information    [facility]. Schedule an appointment as soon as possible for a visit in 4 week(s).   Specialty: Obstetrics and Gynecology Why: Postpartum follow up  Contact information: [address], [address] [place] [address] [phone]           Please schedule this patient for Postpartum visit in: 4 weeks with the following provider: APP For C/S patients schedule nurse incision check in weeks 2 weeks: no Low risk pregnancy complicated by: teen pregnancy Delivery mode:  SVD Anticipated Birth Control:  other/unsure PP Procedures needed: birth control  Schedule Integrated BH visit: no   Newborn Data: Live born female  Birth Weight:  2905g  APGAR: 9, 9  Newborn Delivery   Birth date/time: 05/12/2019 08:39:00 Delivery type: Vaginal, Spontaneous      Baby Feeding: Breast Disposition:home with mother   05/14/2019 Catherine L Wallace, DO   

## 2019-05-12 NOTE — H&P (Signed)
Mallory Mcintyre is a 18 y.o. female presenting for Premature Rupture of Membranes at term with latent labor  Membranes ruptured about .    Patient Active Problem List   Diagnosis Date Noted  . Post term pregnancy at [redacted] weeks gestation 05/12/2019  . GBS (group B Streptococcus carrier), +RV culture, currently pregnant 04/15/2019  . Anemia affecting pregnancy in third trimester 03/11/2019  . Alpha thalassemia silent carrier 11/03/2018  . Marijuana use 10/21/2018  . Encounter for supervision of normal pregnancy, antepartum 10/20/2018  . Scoliosis 11/26/2013   . OB History    Gravida  1   Para  0   Term  0   Preterm  0   AB  0   Living  0     SAB  0   TAB  0   Ectopic  0   Multiple  0   Live Births  0          Past Medical History:  Diagnosis Date  . Scoliosis    Past Surgical History:  Procedure Laterality Date  . SPINAL FUSION  08/12/2013   Family History: family history includes Asthma in an other family member; Diabetes in an other family member; Hypertension in an other family member. Social History:  reports that she has quit smoking. Her smoking use included cigars. She has never used smokeless tobacco. She reports previous drug use. Drug: Marijuana. She reports that she does not drink alcohol.     Maternal Diabetes: No Genetic Screening: Normal Maternal Ultrasounds/Referrals: Normal Fetal Ultrasounds or other Referrals:  None Maternal Substance Abuse:  Yes:  Type: Marijuana Significant Maternal Medications:  None Significant Maternal Lab Results:  Group B Strep positive Other Comments:  States stopped MJ in July  Review of Systems  Constitutional: Negative for chills and fever.  Eyes: Negative for blurred vision.  Cardiovascular: Negative for leg swelling.  Gastrointestinal: Positive for abdominal pain. Negative for constipation, diarrhea, nausea and vomiting.  Genitourinary: Negative for dysuria.  Neurological: Negative for dizziness and focal  weakness.   Maternal Medical History:  Reason for admission: Rupture of membranes.  Nausea.  Contractions: Onset was 3-5 hours ago.   Frequency: irregular.   Perceived severity is moderate.    Fetal activity: Perceived fetal activity is normal.   Last perceived fetal movement was within the past hour.    Prenatal complications: Substance abuse (MJ (stopped July)).   No bleeding, PIH, pre-eclampsia or preterm labor.   Prenatal Complications - Diabetes: none.    Dilation: 4 Effacement (%): 70, 80 Station: -2 Exam by:: [employer], RN Blood pressure 130/83, pulse 85, temperature 98.1 F (36.7 C), resp. rate 20, height 5\' 5"  (1.651 m), weight 79.2 kg, last menstrual period 08/01/2018. Maternal Exam:  Uterine Assessment: Contraction strength is moderate.  Contraction frequency is irregular.   Abdomen: Patient reports no abdominal tenderness. Estimated fetal weight is 7.   Fetal presentation: vertex  Introitus: Normal vulva. Normal vagina.  Ferning test: positive.  Nitrazine test: not done. Amniotic fluid character: clear.  Pelvis: adequate for delivery.   Cervix: Cervix evaluated by digital exam.     Fetal Exam Fetal Monitor Review: Mode: ultrasound.   Baseline rate: 140.  Variability: moderate (6-25 bpm).   Pattern: accelerations present and no decelerations.    Fetal State Assessment: Category I - tracings are normal.     Vitals:   05/12/19 0116 05/12/19 0245  BP: 130/83 (!) 142/92  Pulse: 85 94  Resp: 20   Temp:  98.1 F (36.7 C) 99 F (37.2 C)  TempSrc:  Oral  Weight: 79.2 kg 79.2 kg  Height: 5\' 5"  (1.651 m) 5\' 5"  (1.651 m)    Physical Exam  Constitutional: She is oriented to person, place, and time. She appears well-developed and well-nourished. No distress.  HENT:  Head: Normocephalic.  Cardiovascular: Normal rate and regular rhythm.  Respiratory: Effort normal. No respiratory distress.  GI: Soft. She exhibits no distension. There is no abdominal  tenderness. There is no rebound and no guarding.  Genitourinary:    Vulva normal.     Genitourinary Comments: Dilation: 4 Effacement (%): 70, 80 Station: -2 Presentation: Vertex Exam by:: Elray Mcgregor, RN  + pooling + ferning    Musculoskeletal: Normal range of motion.  Neurological: She is alert and oriented to person, place, and time.  Skin: Skin is warm and dry.  Psychiatric: She has a normal mood and affect.    Prenatal labs: ABO, Rh: B/Positive/-- (03/10 1535) Antibody: Negative (03/10 1535) Rubella: 2.55 (03/10 1535) RPR: Non Reactive (07/02 1146)  HBsAg: Negative (03/10 1535)  HIV: Non Reactive (07/02 1146)  GBS: --Tessie Fass (08/31 1142)  Results for orders placed or performed during the hospital encounter of 05/12/19 (from the past 24 hour(s))  Fern Test     Status: None   Collection Time: 05/12/19  2:06 AM  Result Value Ref Range   POCT Fern Test Positive = ruptured amniotic membanes   SARS Coronavirus 2 [facility] order, Performed in [facility] hospital lab) Nasopharyngeal Nasopharyngeal Swab     Status: None   Collection Time: 05/12/19  2:08 AM   Specimen: Nasopharyngeal Swab  Result Value Ref Range   SARS Coronavirus 2 NEGATIVE NEGATIVE  Type and screen     Status: None   Collection Time: 05/12/19  2:23 AM  Result Value Ref Range   ABO/RH(D) B POS    Antibody Screen NEG    Sample Expiration      05/15/2019,2359 Performed at [facility], [place] [address], [place], [place] [address]   ABO/Rh     Status: None (Preliminary result)   Collection Time: 05/12/19  2:23 AM  Result Value Ref Range   ABO/RH(D)      B POS Performed at [facility] [address], [place] [address]   CBC     Status: Abnormal   Collection Time: 05/12/19  2:29 AM  Result Value Ref Range   WBC 10.7 (H) 4.0 - 10.5 K/uL   RBC 4.48 3.87 - 5.11 MIL/uL   Hemoglobin 12.7 12.0 - 15.0 g/dL   HCT 39.3 36.0 - 46.0 %   MCV 87.7 80.0 - 100.0 fL   MCH 28.3 26.0 - 34.0 pg    MCHC 32.3 30.0 - 36.0 g/dL   RDW 14.6 11.5 - 15.5 %   Platelets 289 150 - 400 K/uL   nRBC 0.0 0.0 - 0.2 %    Assessment/Plan: [place] intrauterine pregnancy at [redacted]w[redacted]d Premature rupture of membranes at term Latent phase labor Group B STrep positive Single elevation in blood pressure   Admit to Labor and Delivery Routine orders Penicillin prophylaxis Watch BPs, may need to add labs if persistent Anticipate SVD (pt prefers LLK, JE, or [place])  Hansel Feinstein 05/12/2019, 2:29 AM

## 2019-05-12 NOTE — Progress Notes (Signed)
Patient ID: Mallory Mcintyre, female   DOB: [birth year], 18 y.o.   MRN: [id number] Got dose of Fentanyl for pain and got good relief Contractions temporarily slowed but are now increasing in frequency  Vitals:   05/12/19 0116 05/12/19 0245 05/12/19 0419 05/12/19 0528  BP: 130/83 (!) 142/92 110/63 121/73  Pulse: 85 94 77 80  Resp: 20     Temp: 98.1 F (36.7 C) 99 F (37.2 C)  98.8 F (37.1 C)  TempSrc:  Oral  Oral  Weight: 79.2 kg 79.2 kg    Height: 5\' 5"  (1.651 m) 5\' 5"  (1.651 m)     Fetal heart rate reactive, Category I UCs every 2-5 min  .cervix exam deferred  Anticipate increased labor

## 2019-05-12 NOTE — MAU Note (Addendum)
PT SAYS AT 12MN  - WHILE DOWN - SHE FELT POP- AND FLUID CAME OUT . HAD  SEX AT 10PM.  [place].  DENIES HSV AND MRSA.  GBS- POSITIVE .  VE  ON Monday - 4 CM  FEELS  MILD UC

## 2019-05-13 ENCOUNTER — Other Ambulatory Visit (HOSPITAL_COMMUNITY)
Admission: RE | Admit: 2019-05-13 | Discharge: 2019-05-13 | Disposition: A | Discharge status: Home / Self Care | Payer: Medicaid Other | Source: Ambulatory Visit | Attending: Obstetrics & Gynecology | Admitting: Obstetrics & Gynecology

## 2019-05-13 LAB — BIRTH TISSUE RECOVERY COLLECTION (PLACENTA DONATION)

## 2019-05-13 LAB — RPR: RPR Ser Ql: NONREACTIVE

## 2019-05-13 NOTE — Progress Notes (Signed)
Post Partum Day #1 Subjective: no complaints, up ad lib and tolerating PO; continuing to work on breastfeeding; not interested in contraception- options rev'd  Objective: BPs: 93/49, 126/84, 124/81  Blood pressure (!) 132/93, pulse (!) 59, temperature 97.8 F (36.6 C), temperature source Oral, resp. rate 16, height 5\' 5"  (1.651 m), weight 79.2 kg, last menstrual period 08/01/2018, SpO2 100 %, unknown if currently breastfeeding.  Physical Exam:  General: alert, cooperative and no distress Lochia: appropriate Uterine Fundus: firm DVT Evaluation: No evidence of DVT seen on physical exam.  Recent Labs    05/12/19 0229  HGB 12.7  HCT 39.3    Assessment/Plan: Plan for discharge tomorrow  Watch BPs   LOS: 1 day   Myrtis Ser CNM 05/13/2019, 8:51 AM

## 2019-05-13 NOTE — Lactation Note (Signed)
This note was copied from a baby's chart. Lactation Consultation Note  Patient Name: Girl Agustina Witzke [id number] Date: 05/13/2019 Reason for consult: Term;Primapara;Difficult latch Baby is 29 hours old/3% weight loss.  Mom states baby last fed formula 6 hours ago.  Baby is currently awake and showing feeding cues.  Assisted with positioning baby in cross cradle hold on left breast.  Hand expression done and small drop of colostrum expressed.  Baby opened wide and latched briefly but then started lip and tongue sucking.  20 mm nipple shield applied.  Baby latched well and sustained latch for 10 minutes.  Very few swallows noted and a few drops noted in shield.  Burped and assisted with positioning baby in football hold on right.  20 mm nipple shield applied.  Baby latched but became fussy at breast.  Recommended supplementing with a small amount of formula after breastfeeding until milk comes to volume.  Instructed to post pump every 3 hours to establish milk supply.  Plan is to put baby to breast using nipple shield with feeding cues, feed on both breasts, supplement with formula and post pump.  Encouraged to call for assist/concerns prn.  Maternal Data    Feeding Feeding Type: Breast Fed  LATCH Score Latch: Repeated attempts needed to sustain latch, nipple held in mouth throughout feeding, stimulation needed to elicit sucking reflex.  Audible Swallowing: A few with stimulation  Type of Nipple: Everted at rest and after stimulation  Comfort (Breast/Nipple): Soft / non-tender  Hold (Positioning): Assistance needed to correctly position infant at breast and maintain latch.  LATCH Score: 7  Interventions Interventions: Breast compression;Assisted with latch;Adjust position;Support pillows;Breast massage;Position options;Hand express  Lactation Tools Discussed/Used Tools: Nipple Shields Nipple shield size: 20   Consult Status Consult Status: Follow-up Date: 05/14/19 Follow-up type:  In-patient    Ave Filter 05/13/2019, 2:34 PM

## 2019-05-13 NOTE — Clinical Social Work Maternal (Signed)
CLINICAL SOCIAL WORK MATERNAL/CHILD NOTE  Patient Details  Name: Mallory Mcintyre MRN: [id number] Date of Birth: 12/18/2000  Date:  05/13/2019  Clinical Social Worker Initiating Note:  Durward Fortes, LCSW Date/Time: Initiated:  05/13/19/1055     Child's Name:  Mallory Mcintyre   Biological Parents:  Mother   Need for Interpreter:  None   Reason for Referral:  Current Substance Use/Substance Use During Pregnancy    Address:  [address] [place] [address]    Phone number:  [phone] (home)     Additional phone number: none   Household Members/Support Persons (HM/SP):   Household Member/Support Person 2   HM/SP Name Relationship DOB or Age  HM/SP -[facility])  MGM    HM/SP -[place] (MOB) MOB 04/05/1999  HM/SP -3   Elorah Dewing (sister)  sister   01/21/2004  HM/SP -4        HM/SP -5        HM/SP -6        HM/SP -7        HM/SP -8          Natural Supports (not living in the home):  Parent   Professional Supports: None   Employment: Part-time   Type of Work: Theatre manager   Education:  High school graduate   Homebound arranged:  n/a  Museum/gallery curator Resources:  Medicaid   Other Resources:  [facility]   Cultural/Religious Considerations Which May Impact Care:  none reported.   Strengths:  Ability to meet basic needs , Pediatrician chosen, Compliance with medical plan , Home prepared for child    Psychotropic Medications:    none    Pediatrician:    [place]  Pediatrician List:   [facility] ([address])  [place]      Pediatrician Fax Number:    Risk Factors/Current Problems:  Substance Use    Cognitive State:  Able to Concentrate , Alert , Insightful    Mood/Affect:  Relaxed , Comfortable , Calm , Interested   sa CSW Assessment: CSW consulted as MOB used THC during her pregnancy. CSW went to speak  with MOB at bedside to address further needs.   Upon entering the room CSW congratulated MOB on the birth of infant. CSW advised MOB of the reason for the visit as well as CSW's role. CSW was advised that it was okay for FOB to remain in the room while CSW spoke as FOB was asleep in recliner. CSW understanding and proceeded with assessing MOB. MOB reported that she last sued [facility] un July. MOB went on to expressed that she did use THC earlier in her pregnancy., however she stopped and then restarted as she began to loose weight from not eating. CSW understanding but also advised MOB of the hospital drug screen policy. MOB was made aware that infants UDS is negative however there is another drug screen being done-the cord. CSW explained to MOB what would happen in the event that infants CDS was positive. MOB appeared afraid and some what nervous. CSW attempted to ease MOB's worry a little.   CSW inquired from MOB on her mental health history in which MOB denied ever having any diagnosed. MOB reports feelings are at home and denies SI or HI at this time. MOB expressed that she has all needed items  to care for infant at this time. CSW provided MOB with PPD and SIDS education. MOB was also given PPD Checklist in order to keep track of feelings as they relate to PPD.   CSW will continue to monitor infants CDS to make CPS report if warranted.   CSW Plan/Description:  No Further Intervention Required/No Barriers to Discharge, Sudden Infant Death Syndrome (SIDS) Education, Perinatal Mood and Anxiety Disorder (PMADs) Education, Hospital Drug Screen Policy Information, CSW Will Continue to Monitor Umbilical Cord Tissue Drug Screen Results and Make Report if Warranted    Devanta Daniel S Marcell Chavarin, LCSWA 05/13/2019, 11:20 AM 

## 2019-05-14 MED ORDER — SENNOSIDES-DOCUSATE SODIUM 8.6-50 MG PO TABS: 2.0000 | ORAL_TABLET | ORAL | 0 refills | Status: DC

## 2019-05-14 MED ORDER — IBUPROFEN 600 MG PO TABS: 600.0000 mg | ORAL_TABLET | Freq: Four times a day (QID) | ORAL | 1 refills | Status: DC

## 2019-05-14 NOTE — Discharge Instructions (Signed)

## 2019-05-14 NOTE — Lactation Note (Signed)
This note was copied from a baby's chart. Lactation Consultation Note  Patient Name: Mallory Mcintyre [id number] Date: 05/14/2019 Reason for consult: Follow-up assessment;Difficult latch;Primapara;Term Baby is 49 hours old/4% weight loss. Mom is putting baby baby to breast with cues using the nipple shield on the right.  She feels breasts are becoming fuller.  Discussed milk coming to volume and the prevention and treatment of engorgement.  Mom has a Medela DEBP at home.  Reminded to always pump if not putting baby to breast.  Discussed supply and demand.  Mom is motivated to breastfeed and states her mother breastfed 3 children.  Questions answered.  Reviewed outpatient lactation services and encouraged to call prn.  Maternal Data    Feeding    LATCH Score                   Interventions    Lactation Tools Discussed/Used     Consult Status Consult Status: Complete Follow-up type: Call as needed    Ave Filter 05/14/2019, 9:49 AM

## 2019-05-15 ENCOUNTER — Inpatient Hospital Stay (HOSPITAL_COMMUNITY): Payer: Medicaid Other

## 2019-05-15 ENCOUNTER — Inpatient Hospital Stay (HOSPITAL_COMMUNITY): Admission: AD | Admit: 2019-05-15 | Payer: Medicaid Other | Source: Home / Self Care

## 2019-05-27 ENCOUNTER — Telehealth: Payer: Self-pay

## 2019-05-27 NOTE — Telephone Encounter (Signed)
Patient called wanting a note to return to work next Tuesday Oct 20,2020.   Note created - patient will call back with fax number or come pick up at front desk. Kathrene Alu RN

## 2019-06-16 ENCOUNTER — Encounter: Payer: Self-pay | Admitting: Women's Health

## 2019-06-16 ENCOUNTER — Encounter: Payer: Self-pay | Admitting: Obstetrics

## 2019-06-16 ENCOUNTER — Other Ambulatory Visit: Payer: Self-pay

## 2019-06-16 ENCOUNTER — Ambulatory Visit (INDEPENDENT_AMBULATORY_CARE_PROVIDER_SITE_OTHER): Payer: Medicaid Other | Admitting: Women's Health

## 2019-06-16 DIAGNOSIS — Z1389 Encounter for screening for other disorder: Secondary | ICD-10-CM

## 2019-06-16 DIAGNOSIS — Z3042 Encounter for surveillance of injectable contraceptive: Secondary | ICD-10-CM | POA: Diagnosis not present

## 2019-06-16 DIAGNOSIS — Z3202 Encounter for pregnancy test, result negative: Secondary | ICD-10-CM | POA: Diagnosis not present

## 2019-06-16 LAB — POCT URINE PREGNANCY: Preg Test, Ur: NEGATIVE

## 2019-06-16 MED ORDER — MEDROXYPROGESTERONE ACETATE 150 MG/ML IM SUSP
150.0000 mg | Freq: Once | INTRAMUSCULAR | Status: AC
  Administered 2019-06-16: 150 mg via INTRAMUSCULAR

## 2019-06-16 MED ORDER — MEDROXYPROGESTERONE ACETATE 150 MG/ML IM SUSP: 150.0000 mg | INTRAMUSCULAR | 4 refills | Status: DC

## 2019-06-16 NOTE — Patient Instructions (Signed)
You have received a prescription for the Depo-Provera injection for birth control today. You are not fully protected from pregnancy until 7 days after your injection. If you have sex in the next week, use a backup method to prevent pregnancy, such as a condom. Return in 3 months for your next shot - refer to handout provided for dates. The Depo-Provera injection does not protect from sexually transmitted infections (STIs). Use condoms with each new or untested partner.  Take calcium and vitamin D (either through diet or by added calcium and vitamin D supplements). Get regular weight-bearing exercises like walking, running, and weight-lifting.     Medroxyprogesterone injection [Contraceptive] What is this medicine? MEDROXYPROGESTERONE (me DROX ee proe JES te rone) contraceptive injections prevent pregnancy. They provide effective birth control for 3 months. Depo-subQ Provera 104 is also used for treating pain related to endometriosis. This medicine may be used for other purposes; ask your health care provider or pharmacist if you have questions. COMMON BRAND NAME(S): Depo-Provera, Depo-subQ Provera 104 What should I tell my health care provider before I take this medicine? They need to know if you have any of these conditions:  frequently drink alcohol  asthma  blood vessel disease or a history of a blood clot in the lungs or legs  bone disease such as osteoporosis  breast cancer  diabetes  eating disorder (anorexia nervosa or bulimia)  high blood pressure  HIV infection or AIDS  kidney disease  liver disease  mental depression  migraine  seizures (convulsions)  stroke  tobacco smoker  vaginal bleeding  an unusual or allergic reaction to medroxyprogesterone, other hormones, medicines, foods, dyes, or preservatives  pregnant or trying to get pregnant  breast-feeding How should I use this medicine? Depo-Provera Contraceptive injection is given into a muscle.  Depo-subQ Provera 104 injection is given under the skin. These injections are given by a health care professional. You must not be pregnant before getting an injection. The injection is usually given during the first 5 days after the start of a menstrual period or 6 weeks after delivery of a baby. Talk to your pediatrician regarding the use of this medicine in children. Special care may be needed. These injections have been used in female children who have started having menstrual periods. Overdosage: If you think you have taken too much of this medicine contact a poison control center or emergency room at once. NOTE: This medicine is only for you. Do not share this medicine with others. What if I miss a dose? Try not to miss a dose. You must get an injection once every 3 months to maintain birth control. If you cannot keep an appointment, call and reschedule it. If you wait longer than 13 weeks between Depo-Provera contraceptive injections or longer than 14 weeks between Depo-subQ Provera 104 injections, you could get pregnant. Use another method for birth control if you miss your appointment. You may also need a pregnancy test before receiving another injection. What may interact with this medicine? Do not take this medicine with any of the following medications:  bosentan This medicine may also interact with the following medications:  aminoglutethimide  antibiotics or medicines for infections, especially rifampin, rifabutin, rifapentine, and griseofulvin  aprepitant  barbiturate medicines such as phenobarbital or primidone  bexarotene  carbamazepine  medicines for seizures like ethotoin, felbamate, oxcarbazepine, phenytoin, topiramate  modafinil  St. John's wort This list may not describe all possible interactions. Give your health care provider a list of all the medicines,  drugs, or dietary supplements you use. Also tell them if you smoke, drink alcohol, or use illegal  drugs. Some items may interact with your medicine. What should I watch for while using this medicine? This drug does not protect you against HIV infection (AIDS) or other sexually transmitted diseases. Use of this product may cause you to lose calcium from your bones. Loss of calcium may cause weak bones (osteoporosis). Only use this product for more than 2 years if other forms of birth control are not right for you. The longer you use this product for birth control the more likely you will be at risk for weak bones. Ask your health care professional how you can keep strong bones. You may have a change in bleeding pattern or irregular periods. Many females stop having periods while taking this drug. If you have received your injections on time, your chance of being pregnant is very low. If you think you may be pregnant, see your health care professional as soon as possible. Tell your health care professional if you want to get pregnant within the next year. The effect of this medicine may last a long time after you get your last injection. What side effects may I notice from receiving this medicine? Side effects that you should report to your doctor or health care professional as soon as possible:  allergic reactions like skin rash, itching or hives, swelling of the face, lips, or tongue  breast tenderness or discharge  breathing problems  changes in vision  depression  feeling faint or lightheaded, falls  fever  pain in the abdomen, chest, groin, or leg  problems with balance, talking, walking  unusually weak or tired  yellowing of the eyes or skin Side effects that usually do not require medical attention (report to your doctor or health care professional if they continue or are bothersome):  acne  fluid retention and swelling  headache  irregular periods, spotting, or absent periods  temporary pain, itching, or skin reaction at site where injected  weight gain This list  may not describe all possible side effects. Call your doctor for medical advice about side effects. You may report side effects to FDA at 1-800-FDA-1088. Where should I keep my medicine? This does not apply. The injection will be given to you by a health care professional. NOTE: This sheet is a summary. It may not cover all possible information. If you have questions about this medicine, talk to your doctor, pharmacist, or health care provider.  2020 Elsevier/Gold Standard (2008-08-19 18:37:56)  

## 2019-06-16 NOTE — Progress Notes (Signed)
Post Partum Exam  Mallory Mcintyre is a 18 y.o. G106P1001 female who presents for a postpartum visit. She is 5 weeks postpartum following a spontaneous vaginal delivery. I have fully reviewed the prenatal and intrapartum course. The delivery was at 40 gestational weeks.  Anesthesia: IV sedation. Postpartum course has been Unremarkable. Baby's course has been Unremarkable. Baby is feeding by bottle - Similac Alimentum. Bleeding no bleeding. Bowel function is normal. Bladder function is normal. Patient is sexually active. Contraception method is condoms. Postpartum depression screening:neg EPDS= 3.  PMH unremarkable. NKDA. No hx of HTN, DM, asthma, blood clots, migraines, liver or heart problems. Pt sexually active since delivery, condom use 100%. UPT today neg. Discussed administration, side effects, warning signs, time to effectiveness, back-up method PRN, protecting against STDs.  The following portions of the patient's history were reviewed and updated as appropriate: allergies, current medications, past family history, past medical history, past social history, past surgical history and problem list. Last pap smear done N/A due to age.  Review of Systems Pertinent items noted in HPI and remainder of comprehensive ROS otherwise negative.    Objective:  Last menstrual period 08/01/2018, unknown if currently breastfeeding.  General:  alert, cooperative and no distress   Breasts:  pt declines and is not breastfeeding.  Lungs: clear to auscultation bilaterally  Heart:  regular rate and rhythm, S1, S2 normal, no murmur, click, rub or gallop  Abdomen: soft, non-tender; bowel sounds normal; no masses,  no organomegaly   Vulva:  normal  Vagina: normal vagina  Cervix:  no cervical motion tenderness and no lesions  Corpus: normal size, contour, position, consistency, mobility, non-tender  Adnexa:  normal adnexa and no mass, fullness, tenderness  Rectal Exam: Not performed.        Assessment:    Normal  postpartum exam.   Plan:   1. Contraception: Depo-Provera injections 2. Follow up in: 1 year or as needed.

## 2019-06-16 NOTE — Addendum Note (Signed)
Addended by: Courtney Heys on: 06/16/2019 11:35 AM   Modules accepted: Orders

## 2019-06-16 NOTE — Addendum Note (Signed)
Addended by: Courtney Heys on: 06/16/2019 11:29 AM   Modules accepted: Orders

## 2019-09-08 ENCOUNTER — Ambulatory Visit: Payer: Medicaid Other

## 2020-12-19 ENCOUNTER — Other Ambulatory Visit: Payer: Self-pay

## 2020-12-19 ENCOUNTER — Emergency Department (HOSPITAL_COMMUNITY): Payer: Medicaid Other

## 2020-12-19 ENCOUNTER — Emergency Department (HOSPITAL_COMMUNITY)
Admission: EM | Admit: 2020-12-19 | Discharge: 2020-12-19 | Disposition: A | Discharge status: Home / Self Care | Payer: Medicaid Other | Attending: Emergency Medicine | Admitting: Emergency Medicine

## 2020-12-19 ENCOUNTER — Encounter (HOSPITAL_COMMUNITY): Payer: Self-pay | Admitting: Emergency Medicine

## 2020-12-19 DIAGNOSIS — M549 Dorsalgia, unspecified: Secondary | ICD-10-CM | POA: Diagnosis present

## 2020-12-19 DIAGNOSIS — Z87891 Personal history of nicotine dependence: Secondary | ICD-10-CM | POA: Diagnosis not present

## 2020-12-19 DIAGNOSIS — M545 Low back pain, unspecified: Secondary | ICD-10-CM | POA: Insufficient documentation

## 2020-12-19 DIAGNOSIS — Y990 Civilian activity done for income or pay: Secondary | ICD-10-CM | POA: Diagnosis not present

## 2020-12-19 DIAGNOSIS — G8929 Other chronic pain: Secondary | ICD-10-CM | POA: Diagnosis not present

## 2020-12-19 LAB — POC URINE PREG, ED: Preg Test, Ur: NEGATIVE

## 2020-12-19 MED ORDER — METHYLPREDNISOLONE 4 MG PO TBPK: ORAL_TABLET | ORAL | 0 refills | Status: DC

## 2020-12-19 MED ORDER — DEXAMETHASONE SODIUM PHOSPHATE 10 MG/ML IJ SOLN
10.0000 mg | Freq: Once | INTRAMUSCULAR | Status: AC
  Administered 2020-12-19: 10 mg via INTRAMUSCULAR
  Filled 2020-12-19: qty 1

## 2020-12-19 NOTE — ED Provider Notes (Signed)
[name] Provider Note   CSN: [id number] Arrival date & time: 12/19/20  1857     History Chief Complaint  Patient presents with  . Back Pain    Mallory Mcintyre is a 20 y.o. female.  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  She reports that she does not do heavy lifting.  She reports the pain does go down both of her legs from time to time.  She does not have any numbness or weakness, she denies any fevers or chills, this is chronic, she was sent home from work tonight because of her pain and stating that it was hard for her to walk.  She does not take any medicines daily for any reason including pain stating that the pain medicines don't work anymore.  She does not have a PCP and does not see a spinal surgeon.  Past Medical History:  Diagnosis Date  . Encounter for supervision of normal pregnancy, antepartum 10/20/2018    Nursing Staff Provider Office Location  [facility] Dating   LMP Language   English  Anatomy US   normal female  Flu Vaccine   Declined 10/20/2018 Genetic Screen  NIPS: low risk  AFP:  Negative   TDaP vaccine  Declined 04-26-19 Hgb A1C or  GTT   Third trimester 1hr GTT 79 Rhogam     LAB RESULTS  Feeding Plan  Breast Blood Type B/Positive/-- (03/10 1535)  Contraception  Undecided 04-26-19 Antibody Negati  . Scoliosis     Patient Active Problem List   Diagnosis Date Noted  . Alpha thalassemia silent carrier 11/03/2018  . Marijuana use 10/21/2018  . Scoliosis 11/26/2013    Past Surgical History:  Procedure Laterality Date  . SPINAL FUSION  08/12/2013     OB History    Gravida  1   Para  1   Term  1   Preterm  0   AB  0   Living  1     SAB  0   IAB  0   Ectopic  0    Multiple  0   Live Births  1           Family History  Problem Relation Age of Onset  . Diabetes Other   . Asthma Other   . Hypertension Other     Social History   Tobacco Use  . Smoking status: Former Smoker    Types: Cigars  . Smokeless tobacco: Never Used  . Tobacco comment: 2-3 Black n mild daily   Vaping Use  . Vaping Use: Never used  Substance Use Topics  . Alcohol use: No  . Drug use: Not Currently    Types: Marijuana    Comment: last smoked June 2020    Home Medications Prior to Admission medications   Medication Sig Start Date End Date Taking? Authorizing Provider  methylPREDNISolone (MEDROL DOSEPAK) 4 MG TBPK tablet Taper over 6 days 12/19/20  Yes Noemi Chapel, MD  acetaminophen (TYLENOL) 325 MG tablet Take 650 mg by mouth every 6 (six) hours as needed.    [provider]  Blood Pressure Monitor KIT 1 each by Does not apply route daily. Patient not taking: Reported  on 06/16/2019 12/21/18   Constant, Peggy, MD  ibuprofen (ADVIL) 600 MG tablet Take 1 tablet (600 mg total) by mouth every 6 (six) hours. Patient not taking: Reported on 06/16/2019 05/14/19   Wallace, Catherine Lauren, DO  medroxyPROGESTERone (DEPO-PROVERA) 150 MG/ML injection Inject 1 mL (150 mg total) into the muscle every 3 (three) months. 06/16/19   Nugent, Nicole E, NP  senna-docusate (SENOKOT-S) 8.6-50 MG tablet Take 2 tablets by mouth daily. Patient not taking: Reported on 06/16/2019 05/15/19   Wallace, Catherine Lauren, DO    Allergies    Patient has no known allergies.  Review of Systems   Review of Systems  Constitutional: Negative for fever.  Musculoskeletal: Positive for back pain.  Neurological: Negative for weakness and numbness.    Physical Exam Updated Vital Signs BP (!) 151/95 (BP Location: Left Arm)   Pulse 71   Temp 98.2 F (36.8 C) (Oral)   Resp 18   SpO2 98%   Physical Exam Vitals and nursing note reviewed.  Constitutional:      Appearance: She is  well-developed. She is not diaphoretic.  HENT:     Head: Normocephalic and atraumatic.  Eyes:     General:        Right eye: No discharge.        Left eye: No discharge.     Conjunctiva/sclera: Conjunctivae normal.  Pulmonary:     Effort: Pulmonary effort is normal. No respiratory distress.  Musculoskeletal:     Comments: No back ttp - no spinal ttp.  Skin:    General: Skin is warm and dry.     Findings: No erythema or rash.  Neurological:     Mental Status: She is alert.     Coordination: Coordination normal.     Comments: Totally normal strength and sensation in the LE's bilaterally - able to flex and extend ad the knee and hip and ankle wit normal strength and normal sensation  Psychiatric:        Mood and Affect: Mood normal.     ED Results / Procedures / Treatments   Labs (all labs ordered are listed, but only abnormal results are displayed) Labs Reviewed  POC URINE PREG, ED    EKG None  Radiology DG Lumbar Spine Complete  Result Date: 12/19/2020 CLINICAL DATA:  SI joint pain on the left side worsening back pain EXAM: LUMBAR SPINE - COMPLETE 4+ VIEW COMPARISON:  None. FINDINGS: Mild levoscoliosis of the lumbar spine. Incompletely visualized posterior rods and fixating screws within the thoracolumbar spine, rods visible down to the L3 level. Trace retrolisthesis L3 on L4. Vertebral body heights are maintained. IMPRESSION: Mild scoliosis and and surgical changes of the thoracolumbar spine. No acute osseous abnormality. Electronically Signed   By: Kim  Fujinaga M.D.   On: 12/19/2020 20:39    Procedures Procedures   Medications Ordered in ED Medications  dexamethasone (DECADRON) injection 10 mg (has no administration in time range)    ED Course  I have reviewed the triage vital signs and the nursing notes.  Pertinent labs & imaging results that were available during my care of the patient were reviewed by me and considered in my medical decision making (see chart  for details).    MDM Rules/Calculators/A&P                          The patient has a normal neurologic exam, her complaints are related to chronic pain and in someway   related to being sent home from work.  She is requesting a work note, I have offered her Decadron, Medrol Dosepak and referral to local spinal surgery, she is agreeable to the plan.  I have reviewed her x-rays and they show no signs of acute findings.  This with no abnormal neuro sx suggests very stable for d/c  Final Clinical Impression(s) / ED Diagnoses Final diagnoses:  Chronic midline low back pain without sciatica    Rx / DC Orders ED Discharge Orders         Ordered    methylPREDNISolone (MEDROL DOSEPAK) 4 MG TBPK tablet        12/19/20 2204           Miller, Brian, MD 12/19/20 2205  

## 2020-12-19 NOTE — ED Provider Notes (Signed)
Emergency Medicine Provider Triage Evaluation Note  Mallory Mcintyre , a 20 y.o. female  was evaluated in triage.  Pt complains of left sided back pain. History of chronic back pain from scoliosis s/p surgical repair but this pain is worse/different.  Works at [employer]. No fall. No known injury.   Review of Systems  Positive: Back pain Negative: Fever, abdominal pain, vomiting, diarrhea, dysuria, hematuria, weakness   Physical Exam  BP (!) 151/95 (BP Location: Left Arm)   Pulse 71   Resp 18  Gen:   Awake, no distress   Resp:  Normal effort  MSK:   Ambulatory without obvious discomfort, no antalgia.  Mild left sided lumbar/SI joint tenderness Skin:  Skin normal over low back/no rash  Medical Decision Making  Medically screening exam initiated at 7:14 PM.  Appropriate orders placed.  Ena Demary was informed that the remainder of the evaluation will be completed by another provider, this initial triage assessment does not replace that evaluation, and the importance of remaining in the ED until their evaluation is complete.    Liberty Handy, PA-C 12/19/20 1916    Eber Hong, MD 12/19/20 2200

## 2020-12-19 NOTE — ED Triage Notes (Signed)
Pt c/o worsening back pain. Hx scoliosis, denies injury. Ambulatory without difficulty.

## 2020-12-19 NOTE — ED Notes (Signed)
Unable to locate in lobby. 

## 2021-02-09 ENCOUNTER — Other Ambulatory Visit: Payer: Self-pay

## 2021-02-09 ENCOUNTER — Encounter (HOSPITAL_COMMUNITY): Payer: Self-pay | Admitting: Emergency Medicine

## 2021-02-09 ENCOUNTER — Ambulatory Visit (HOSPITAL_COMMUNITY)
Admission: EM | Admit: 2021-02-09 | Discharge: 2021-02-09 | Disposition: A | Discharge status: Home / Self Care | Payer: Medicaid Other | Attending: Physician Assistant | Admitting: Physician Assistant

## 2021-02-09 DIAGNOSIS — M546 Pain in thoracic spine: Secondary | ICD-10-CM | POA: Diagnosis not present

## 2021-02-09 DIAGNOSIS — R200 Anesthesia of skin: Secondary | ICD-10-CM

## 2021-02-09 DIAGNOSIS — K5900 Constipation, unspecified: Secondary | ICD-10-CM

## 2021-02-09 MED ORDER — TIZANIDINE HCL 4 MG PO CAPS: 4.0000 mg | ORAL_CAPSULE | Freq: Three times a day (TID) | ORAL | 0 refills | Status: DC | PRN

## 2021-02-09 MED ORDER — KETOROLAC TROMETHAMINE 30 MG/ML IJ SOLN
INTRAMUSCULAR | Status: AC
  Filled 2021-02-09: qty 1

## 2021-02-09 MED ORDER — NAPROXEN 375 MG PO TABS: 375.0000 mg | ORAL_TABLET | Freq: Two times a day (BID) | ORAL | 0 refills | Status: DC

## 2021-02-09 MED ORDER — KETOROLAC TROMETHAMINE 30 MG/ML IJ SOLN
30.0000 mg | Freq: Once | INTRAMUSCULAR | Status: AC
  Administered 2021-02-09: 30 mg via INTRAMUSCULAR

## 2021-02-09 NOTE — Discharge Instructions (Signed)
You were given Toradol today so please do not take any NSAIDs for the remainder of the day including aspirin, ibuprofen/Advil, naproxen/Aleve.  Please take Naprosyn beginning tomorrow but do not take additional NSAIDs while taking this medication as it can cause stomach bleeding.  Take Zanaflex up to 3 times a day as needed for muscle pain.  This make you sleepy so do not drive or drink alcohol with it.  As we discussed, if you have any persistent leg numbness, trouble going to the bathroom or incontinence, weakness in your legs you need to go to the emergency room for an MRI of your lumbar spine.  Follow-up with neurosurgery as we discussed.

## 2021-02-09 NOTE — ED Provider Notes (Signed)
[place]    CSN: [id number] Arrival date & time: 02/09/21  0930      History   Chief Complaint Chief Complaint  Patient presents with   Back Pain    HPI Mallory Mcintyre is a 20 y.o. female.   Patient presents today with a several week history of worsening thoracic back pain.  Patient has a history of scoliosis and had spinal fusion in 2015.  Since that time she has had intermittent pain that has worsened recently prompting evaluation.  She was seen in the emergency room and ultimately by a local neurosurgeon but was not provided any relief and recommended she follow-up with Gaspar Cola who performed surgery.  She has had x-rays that showed no obvious orthopedic hardware problems.  She did have MRIs with neurosurgeon but this information is not available in care everywhere.  She does report some medial thigh numbness as well as bladder incontinence.  She reports associated constipation but denies any fecal incontinence.  Reports that the symptoms have been ongoing which she has had MRI since they began that was normal.  She denies any changes to her activity or known injury.  Reports pain is rated 10 on a 0-10 pain scale, localized to thoracic spine without radiation, described as tightness/stiffness, no aggravating or alleviating factors identified.  She has tried numerous over-the-counter medications without improvement of symptoms.  She denies any history of nephrolithiasis or urinary symptoms.   Past Medical History:  Diagnosis Date   Encounter for supervision of normal pregnancy, antepartum 10/20/2018    Nursing Staff Provider Office Location  [facility] Dating   LMP Language   English  Anatomy US   normal female  Flu Vaccine   Declined 10/20/2018 Genetic Screen  NIPS: low risk  AFP:  Negative   TDaP vaccine  Declined 04-26-19 Hgb A1C or  GTT   Third trimester 1hr GTT 79 Rhogam     LAB RESULTS  Feeding Plan  Breast Blood Type B/Positive/-- (03/10 1535)  Contraception  Undecided  04-26-19 Antibody Negati   Scoliosis     Patient Active Problem List   Diagnosis Date Noted   Alpha thalassemia silent carrier 11/03/2018   Marijuana use 10/21/2018   Scoliosis 11/26/2013    Past Surgical History:  Procedure Laterality Date   SPINAL FUSION  08/12/2013    OB History     Gravida  1   Para  1   Term  1   Preterm  0   AB  0   Living  1      SAB  0   IAB  0   Ectopic  0   Multiple  0   Live Births  1            Home Medications    Prior to Admission medications   Medication Sig Start Date End Date Taking? Authorizing Provider  naproxen (NAPROSYN) 375 MG tablet Take 1 tablet (375 mg total) by mouth 2 (two) times daily. 02/09/21  Yes Kammi Hechler K, PA-C  tiZANidine (ZANAFLEX) 4 MG capsule Take 1 capsule (4 mg total) by mouth 3 (three) times daily as needed for muscle spasms. 02/09/21  Yes Kaslyn Richburg, Derry Skill, PA-C  acetaminophen (TYLENOL) 325 MG tablet Take 650 mg by mouth every 6 (six) hours as needed.    [provider]  Blood Pressure Monitor KIT 1 each by Does not apply route daily. Patient not taking: Reported on 06/16/2019 12/21/18   Constant, Vickii Chafe, MD  medroxyPROGESTERone (DEPO-PROVERA) 150 MG/ML injection Inject 1 mL (150 mg total) into the muscle every 3 (three) months. 06/16/19   Nugent, Gerrie Nordmann, NP  methylPREDNISolone (MEDROL DOSEPAK) 4 MG TBPK tablet Taper over 6 days 12/19/20   Noemi Chapel, MD  senna-docusate (SENOKOT-S) 8.6-50 MG tablet Take 2 tablets by mouth daily. Patient not taking: Reported on 06/16/2019 05/15/19   Nicolette Bang, MD    Family History Family History  Problem Relation Age of Onset   Diabetes Other    Asthma Other    Hypertension Other     Social History Social History   Tobacco Use   Smoking status: Former    Pack years: 0.00    Types: Cigars   Smokeless tobacco: Never   Tobacco comments:    2-3 Black n mild daily   Vaping Use   Vaping Use: Never used  Substance Use Topics    Alcohol use: No   Drug use: Not Currently    Types: Marijuana    Comment: last smoked June 2020     Allergies   Patient has no known allergies.   Review of Systems Review of Systems  Constitutional:  Positive for activity change. Negative for appetite change, fatigue and fever.  Respiratory:  Negative for cough and shortness of breath.   Cardiovascular:  Negative for chest pain.  Gastrointestinal:  Positive for constipation. Negative for abdominal pain, diarrhea, nausea and vomiting.  Musculoskeletal:  Positive for back pain, myalgias and neck pain. Negative for arthralgias.  Neurological:  Positive for numbness. Negative for dizziness, weakness, light-headedness and headaches.    Physical Exam Triage Vital Signs ED Triage Vitals  Enc Vitals Group     BP 02/09/21 1049 (!) 130/59     Pulse Rate 02/09/21 1049 62     Resp 02/09/21 1049 16     Temp 02/09/21 1049 98.6 F (37 C)     Temp Source 02/09/21 1049 Oral     SpO2 02/09/21 1049 100 %     Weight --      Height --      Head Circumference --      Peak Flow --      Pain Score 02/09/21 1050 10     Pain Loc --      Pain Edu? --      Excl. in [place]? --    No data found.  Updated Vital Signs BP (!) 130/59   Pulse 62   Temp 98.6 F (37 C) (Oral)   Resp 16   LMP 02/05/2021   SpO2 100%   Visual Acuity Right Eye Distance:   Left Eye Distance:   Bilateral Distance:    Right Eye Near:   Left Eye Near:    Bilateral Near:     Physical Exam Vitals reviewed. Exam conducted with a chaperone present.  Constitutional:      General: She is awake. She is not in acute distress.    Appearance: Normal appearance. She is normal weight. She is not ill-appearing.     Comments: Very pleasant female appears stated age in no acute distress sitting comfortably on exam table  HENT:     Head: Normocephalic and atraumatic.  Cardiovascular:     Rate and Rhythm: Normal rate and regular rhythm.     Heart sounds: Normal heart sounds, S1  normal and S2 normal. No murmur heard. Pulmonary:     Effort: Pulmonary effort is normal.     Breath sounds: Normal breath sounds.  No wheezing, rhonchi or rales.     Comments: Clear to auscultation bilaterally Abdominal:     Palpations: Abdomen is soft.     Tenderness: There is no abdominal tenderness.  Genitourinary:    Rectum: Normal anal tone.     Comments: Adriana, CMA present as chaperone during exam.  Normal rectal tone. Musculoskeletal:     Cervical back: Normal range of motion and neck supple. Tenderness present. No bony tenderness. No spinous process tenderness or muscular tenderness.     Thoracic back: Tenderness present. No bony tenderness.     Lumbar back: No tenderness or bony tenderness. Negative right straight leg raise test and negative left straight leg raise test.  Psychiatric:        Behavior: Behavior is cooperative.     UC Treatments / Results  Labs (all labs ordered are listed, but only abnormal results are displayed) Labs Reviewed - No data to display  EKG   Radiology No results found.  Procedures Procedures (including critical care time)  Medications Ordered in UC Medications  ketorolac (TORADOL) 30 MG/ML injection 30 mg (has no administration in time range)    Initial Impression / Assessment and Plan / UC Course  I have reviewed the triage vital signs and the nursing notes.  Pertinent labs & imaging results that were available during my care of the patient were reviewed by me and considered in my medical decision making (see chart for details).     Patient does report some concerning symptoms for cauda equina including medial leg numbness and constipation.  Patient reports she has had MRI since onset of the symptoms that were normal.  We discussed potential utility of going to the emergency room for lumbar MRI to rule out worsening symptoms but patient declined this today.  Given normal rectal tone on exam she can hold off for the time being on  emergent imaging but discussed that if symptoms worsen in any way she would need to go to the ER to which she expressed understanding.  She was given Toradol in clinic.  She was prescribed Naprosyn with instruction not to take this for 24 hours after receiving Toradol injection and not to take additional NSAIDs including aspirin, ibuprofen/Advil, naproxen/Aleve with this medication due to risk of GI bleeding.  She was prescribed Zanaflex with instruction not to drive or drink alcohol with this medication as drowsiness is a common side effect.  Discussed alarm symptoms that warrant emergent evaluation at length to which patient expressed understanding.  Strict return precautions given to which patient expressed understanding.  Patient denies any concern for pregnancy and is not currently breast-feeding.   Final Clinical Impressions(s) / UC Diagnoses   Final diagnoses:  Acute bilateral thoracic back pain  Numbness in both legs  Constipation, unspecified constipation type     Discharge Instructions      You were given Toradol today so please do not take any NSAIDs for the remainder of the day including aspirin, ibuprofen/Advil, naproxen/Aleve.  Please take Naprosyn beginning tomorrow but do not take additional NSAIDs while taking this medication as it can cause stomach bleeding.  Take Zanaflex up to 3 times a day as needed for muscle pain.  This make you sleepy so do not drive or drink alcohol with it.  As we discussed, if you have any persistent leg numbness, trouble going to the bathroom or incontinence, weakness in your legs you need to go to the emergency room for an MRI of your lumbar spine.  Follow-up with neurosurgery as we discussed.     ED Prescriptions     Medication Sig Dispense Auth. Provider   tiZANidine (ZANAFLEX) 4 MG capsule Take 1 capsule (4 mg total) by mouth 3 (three) times daily as needed for muscle spasms. 30 capsule Edom Schmuhl K, PA-C   naproxen (NAPROSYN) 375 MG tablet  Take 1 tablet (375 mg total) by mouth 2 (two) times daily. 20 tablet Nihal Marzella K, PA-C      I have reviewed the PDMP during this encounter.   Terrilee Croak, PA-C 02/09/21 1139

## 2021-02-09 NOTE — ED Triage Notes (Signed)
PT reports chronic mid back pain. History of scoliosis, has had a spinal fusion 7 years ago. Sees [facility] doc august 9th.

## 2021-02-14 ENCOUNTER — Telehealth (HOSPITAL_COMMUNITY): Payer: Self-pay | Admitting: Emergency Medicine

## 2021-02-14 NOTE — Telephone Encounter (Signed)
Opened in error.  Patient decided not to resend to a different pharmacy and instead will use [facility] at [facility].

## 2021-03-18 ENCOUNTER — Emergency Department (HOSPITAL_COMMUNITY)
Admission: EM | Admit: 2021-03-18 | Discharge: 2021-03-18 | Disposition: A | Discharge status: Home / Self Care | Payer: Medicaid Other | Attending: Emergency Medicine | Admitting: Emergency Medicine

## 2021-03-18 ENCOUNTER — Emergency Department (HOSPITAL_COMMUNITY): Payer: Medicaid Other

## 2021-03-18 ENCOUNTER — Encounter (HOSPITAL_COMMUNITY): Payer: Self-pay

## 2021-03-18 DIAGNOSIS — S39012A Strain of muscle, fascia and tendon of lower back, initial encounter: Secondary | ICD-10-CM

## 2021-03-18 DIAGNOSIS — Y92410 Unspecified street and highway as the place of occurrence of the external cause: Secondary | ICD-10-CM | POA: Diagnosis not present

## 2021-03-18 DIAGNOSIS — M549 Dorsalgia, unspecified: Secondary | ICD-10-CM | POA: Insufficient documentation

## 2021-03-18 DIAGNOSIS — N9489 Other specified conditions associated with female genital organs and menstrual cycle: Secondary | ICD-10-CM | POA: Diagnosis not present

## 2021-03-18 DIAGNOSIS — S0990XA Unspecified injury of head, initial encounter: Secondary | ICD-10-CM | POA: Insufficient documentation

## 2021-03-18 DIAGNOSIS — S161XXA Strain of muscle, fascia and tendon at neck level, initial encounter: Secondary | ICD-10-CM

## 2021-03-18 DIAGNOSIS — M542 Cervicalgia: Secondary | ICD-10-CM | POA: Diagnosis not present

## 2021-03-18 DIAGNOSIS — Z87891 Personal history of nicotine dependence: Secondary | ICD-10-CM | POA: Insufficient documentation

## 2021-03-18 DIAGNOSIS — S29019A Strain of muscle and tendon of unspecified wall of thorax, initial encounter: Secondary | ICD-10-CM

## 2021-03-18 LAB — I-STAT BETA HCG BLOOD, ED (MC, WL, AP ONLY): I-stat hCG, quantitative: <5 m[IU]/mL (ref <5)
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
## 2021-03-18 NOTE — Discharge Instructions (Addendum)
Take ibuprofen 600 mg every 6 hours as needed for pain.  Follow-up with your spine surgeon this week as previously scheduled, and return to the ER if you develop any new and/or concerning symptoms.

## 2021-03-18 NOTE — ED Notes (Signed)
E-signature pad unavailable at time of pt discharge. This RN discussed discharge materials with pt and answered all pt questions. Pt stated understanding of discharge material. ? ?

## 2021-04-22 ENCOUNTER — Other Ambulatory Visit: Payer: Self-pay

## 2021-04-22 ENCOUNTER — Encounter (HOSPITAL_COMMUNITY): Payer: Self-pay | Admitting: *Deleted

## 2021-04-22 ENCOUNTER — Ambulatory Visit (HOSPITAL_COMMUNITY)
Admission: EM | Admit: 2021-04-22 | Discharge: 2021-04-22 | Disposition: A | Discharge status: Home / Self Care | Payer: Medicaid Other | Attending: Student | Admitting: Student

## 2021-04-22 DIAGNOSIS — N760 Acute vaginitis: Secondary | ICD-10-CM | POA: Insufficient documentation

## 2021-04-22 LAB — POCT URINALYSIS DIPSTICK, ED / UC
Glucose, UA: NEGATIVE mg/dL
Hgb urine dipstick: NEGATIVE
Ketones, ur: NEGATIVE mg/dL
Nitrite: NEGATIVE
Protein, ur: 30 mg/dL — AB
Specific Gravity, Urine: 1.025 (ref 1.005–1.030)
Urobilinogen, UA: 0.2 mg/dL (ref 0.0–1.0)
pH: 6.5 (ref 5.0–8.0)

## 2021-04-22 LAB — POC URINE PREG, ED: Preg Test, Ur: NEGATIVE

## 2021-04-22 MED ORDER — METRONIDAZOLE 500 MG PO TABS: 500.0000 mg | ORAL_TABLET | Freq: Two times a day (BID) | ORAL | 0 refills | Status: DC

## 2021-04-22 MED ORDER — ONDANSETRON 8 MG PO TBDP: 8.0000 mg | ORAL_TABLET | Freq: Three times a day (TID) | ORAL | 0 refills | Status: DC | PRN

## 2021-04-22 MED ORDER — FLUCONAZOLE 150 MG PO TABS: 150.0000 mg | ORAL_TABLET | Freq: Every day | ORAL | 0 refills | Status: DC

## 2021-04-22 NOTE — ED Triage Notes (Signed)
Pt reports past 2-3 days she has had vag itching and vag discharge.

## 2021-04-22 NOTE — ED Provider Notes (Signed)
[facility]    CSN: [id number] Arrival date & time: 04/22/21  1059      History   Chief Complaint Chief Complaint  Patient presents with   Vaginal Discharge   Vaginal Itching    HPI Enas Winchel is a 20 y.o. female presenting with vaginal discharge and itching for 3 days.  Medical history BV.  Endorses thin white-yellow discharge and external vaginal itching.  Denies new partners, states she has not been sexually active in about 2 months.  Does state that she works in a [employer], [employer] as a foreign language all day, and is typically wet down there throughout the day due to this.  Has not tried medications for the symptoms.  Nausea without vomiting.  Intermittent crampy lower abdominal pain.  HPI  Past Medical History:  Diagnosis Date   Encounter for supervision of normal pregnancy, antepartum 10/20/2018    Nursing Staff Provider Office Location  [facility] Dating   LMP Language   English  Anatomy US   normal female  Flu Vaccine   Declined 10/20/2018 Genetic Screen  NIPS: low risk  AFP:  Negative   TDaP vaccine  Declined 04-26-19 Hgb A1C or  GTT   Third trimester 1hr GTT 79 Rhogam     LAB RESULTS  Feeding Plan  Breast Blood Type B/Positive/-- (03/10 1535)  Contraception  Undecided 04-26-19 Antibody Negati   Scoliosis     Patient Active Problem List   Diagnosis Date Noted   Alpha thalassemia silent carrier 11/03/2018   Marijuana use 10/21/2018   Scoliosis 11/26/2013    Past Surgical History:  Procedure Laterality Date   SPINAL FUSION  08/12/2013    OB History     Gravida  1   Para  1   Term  1   Preterm  0   AB  0   Living  1      SAB  0   IAB  0   Ectopic  0   Multiple  0   Live Births  1        Obstetric Comments  LMP: 03/14/2021          Home Medications    Prior to Admission medications   Medication Sig Start Date End Date Taking? Authorizing Provider  fluconazole (DIFLUCAN) 150 MG tablet Take 1 tablet (150 mg total) by mouth daily.  -For your yeast infection, start the Diflucan (fluconazole)- Take one pill today (day 1). If you're still having symptoms in 3 days, take the second pill. 04/22/21  Yes Rhys Martini, PA-C  metroNIDAZOLE (FLAGYL) 500 MG tablet Take 1 tablet (500 mg total) by mouth 2 (two) times daily. Avoid alcohol while taking this medication and for 2 days after 04/22/21  Yes Rhys Martini, PA-C  ondansetron (ZOFRAN ODT) 8 MG disintegrating tablet Take 1 tablet (8 mg total) by mouth every 8 (eight) hours as needed for nausea or vomiting. 04/22/21  Yes Rhys Martini, PA-C    Family History Family History  Problem Relation Age of Onset   Diabetes Other    Asthma Other    Hypertension Other     Social History Social History   Tobacco Use   Smoking status: Former    Types: Cigars   Smokeless tobacco: Never   Tobacco comments:    2-3 Black n mild daily   Vaping Use   Vaping Use: Never used  Substance Use Topics   Alcohol use: No   Drug use: Not Currently  Types: Marijuana    Comment: last smoked June 2020     Allergies   Patient has no known allergies.   Review of Systems Review of Systems  Constitutional:  Negative for chills and fever.  HENT:  Negative for sore throat.   Eyes:  Negative for pain and redness.  Respiratory:  Negative for shortness of breath.   Cardiovascular:  Negative for chest pain.  Gastrointestinal:  Negative for abdominal pain, diarrhea, nausea and vomiting.  Genitourinary:  Positive for vaginal discharge. Negative for decreased urine volume, difficulty urinating, dysuria, flank pain, frequency, genital sores, hematuria, menstrual problem, pelvic pain, urgency, vaginal bleeding and vaginal pain.  Musculoskeletal:  Negative for back pain.  Skin:  Negative for rash.  All other systems reviewed and are negative.   Physical Exam Triage Vital Signs ED Triage Vitals  Enc Vitals Group     BP 04/22/21 1229 111/75     Pulse Rate 04/22/21 1229 (!) 59     Resp  04/22/21 1229 20     Temp 04/22/21 1229 98 F (36.7 C)     Temp src --      SpO2 04/22/21 1229 96 %     Weight --      Height --      Head Circumference --      Peak Flow --      Pain Score 04/22/21 1230 0     Pain Loc --      Pain Edu? --      Excl. in GC? --    No data found.  Updated Vital Signs BP 111/75   Pulse (!) 59   Temp 98 F (36.7 C)   Resp 20   LMP 04/11/2021 (Approximate)   SpO2 96%   Visual Acuity Right Eye Distance:   Left Eye Distance:   Bilateral Distance:    Right Eye Near:   Left Eye Near:    Bilateral Near:     Physical Exam Vitals reviewed.  Constitutional:      General: She is not in acute distress.    Appearance: Normal appearance. She is not ill-appearing.  HENT:     Head: Normocephalic and atraumatic.     Mouth/Throat:     Mouth: Mucous membranes are moist.     Comments: Moist mucous membranes Eyes:     Extraocular Movements: Extraocular movements intact.     Pupils: Pupils are equal, round, and reactive to light.  Cardiovascular:     Rate and Rhythm: Normal rate and regular rhythm.     Heart sounds: Normal heart sounds.  Pulmonary:     Effort: Pulmonary effort is normal.     Breath sounds: Normal breath sounds. No wheezing, rhonchi or rales.  Abdominal:     General: Bowel sounds are normal. There is no distension.     Palpations: Abdomen is soft. There is no mass.     Tenderness: There is no abdominal tenderness. There is no right CVA tenderness, left CVA tenderness, guarding or rebound.  Genitourinary:    Comments: deferred Skin:    General: Skin is warm.     Capillary Refill: Capillary refill takes less than 2 seconds.     Comments: Good skin turgor  Neurological:     General: No focal deficit present.     Mental Status: She is alert and oriented to person, place, and time.  Psychiatric:        Mood and Affect: Mood normal.  Behavior: Behavior normal.     UC Treatments / Results  Labs (all labs ordered are  listed, but only abnormal results are displayed) Labs Reviewed  POCT URINALYSIS DIPSTICK, ED / UC - Abnormal; Notable for the following components:      Result Value   Bilirubin Urine SMALL (*)    Protein, ur 30 (*)    Leukocytes,Ua MODERATE (*)    All other components within normal limits  POC URINE PREG, ED  CERVICOVAGINAL ANCILLARY ONLY    EKG   Radiology No results found.  Procedures Procedures (including critical care time)  Medications Ordered in UC Medications - No data to display  Initial Impression / Assessment and Plan / UC Course  I have reviewed the triage vital signs and the nursing notes.  Pertinent labs & imaging results that were available during my care of the patient were reviewed by me and considered in my medical decision making (see chart for details).     This patient is a very pleasant 20 y.o. year old female presenting with vaginitis. Afebrile, nontachycardic, no reproducible abd pain or CVAT.  UA with leuk, did not send culture . Urine preg negative. Denies STI risk. Will send self-swab for G/C, trich, yeast, BV testing. Declines HIV, RPR. Safe sex precautions.    Will manage with diflucan and flagyl while awaiting test results. Zofran for symptomatic relief of nausea.   ED return precautions discussed. Patient verbalizes understanding and agreement.    Final Clinical Impressions(s) / UC Diagnoses   Final diagnoses:  Vaginitis and vulvovaginitis     Discharge Instructions      -For your yeast infection, start the Diflucan (fluconazole)- Take one pill today (day 1). If you're still having symptoms in 3 days, take the second pill.  -For bacterial vaginosis, start the antibiotic-Flagyl (metronidazole), 2 pills daily for 7 days.  You can take this with food if you have a sensitive stomach.  Avoid alcohol while taking this medication and for 2 days after as this will cause severe nausea and vomiting. -Try soaking in a bath for additional  relief. You can also apply a gentle moisturizer down there, like vaseline or Aquaphor, or even lubricant. -Try to stay dry down there if you can, wear lightweight pants and cotton underwear at work     ED Prescriptions     Medication Sig Dispense Auth. Provider   fluconazole (DIFLUCAN) 150 MG tablet Take 1 tablet (150 mg total) by mouth daily. -For your yeast infection, start the Diflucan (fluconazole)- Take one pill today (day 1). If you're still having symptoms in 3 days, take the second pill. 2 tablet Rhys Martini, PA-C   ondansetron (ZOFRAN ODT) 8 MG disintegrating tablet Take 1 tablet (8 mg total) by mouth every 8 (eight) hours as needed for nausea or vomiting. 20 tablet Rhys Martini, PA-C   metroNIDAZOLE (FLAGYL) 500 MG tablet Take 1 tablet (500 mg total) by mouth 2 (two) times daily. Avoid alcohol while taking this medication and for 2 days after 14 tablet Rhys Martini, PA-C      PDMP not reviewed this encounter.   Rhys Martini, PA-C 04/22/21 1311

## 2021-04-22 NOTE — Discharge Instructions (Addendum)
-  For your yeast infection, start the Diflucan (fluconazole)- Take one pill today (day 1). If you're still having symptoms in 3 days, take the second pill.  -For bacterial vaginosis, start the antibiotic-Flagyl (metronidazole), 2 pills daily for 7 days.  You can take this with food if you have a sensitive stomach.  Avoid alcohol while taking this medication and for 2 days after as this will cause severe nausea and vomiting. -Try soaking in a bath for additional relief. You can also apply a gentle moisturizer down there, like vaseline or Aquaphor, or even lubricant. -Try to stay dry down there if you can, wear lightweight pants and cotton underwear at work

## 2021-04-23 LAB — CERVICOVAGINAL ANCILLARY ONLY
Bacterial Vaginitis (gardnerella): NEGATIVE
Candida Glabrata: NEGATIVE
Candida Vaginitis: POSITIVE — AB
Chlamydia: POSITIVE — AB
Comment: NEGATIVE
Comment: NEGATIVE
Comment: NEGATIVE
Comment: NEGATIVE
Comment: NEGATIVE
Comment: NORMAL
Neisseria Gonorrhea: NEGATIVE
Trichomonas: NEGATIVE

## 2021-04-25 ENCOUNTER — Telehealth (HOSPITAL_COMMUNITY): Payer: Self-pay | Admitting: Medical Oncology

## 2021-04-25 MED ORDER — DOXYCYCLINE HYCLATE 100 MG PO CAPS: 100.0000 mg | ORAL_CAPSULE | Freq: Two times a day (BID) | ORAL | 0 refills | Status: DC

## 2021-04-25 NOTE — Telephone Encounter (Signed)
See note from 04/24/2021. Positive for chlamydia. Sending in Doxycyline.

## 2021-05-06 NOTE — Addendum Note (Signed)
Encounter addended by: Brand Males, CNM on: 05/06/2021 7:42 AM  Actions taken: Letter saved

## 2021-05-14 ENCOUNTER — Ambulatory Visit (HOSPITAL_COMMUNITY)
Admission: EM | Admit: 2021-05-14 | Discharge: 2021-05-14 | Disposition: A | Discharge status: Home / Self Care | Payer: Medicaid Other | Attending: Emergency Medicine | Admitting: Emergency Medicine

## 2021-05-14 ENCOUNTER — Other Ambulatory Visit: Payer: Self-pay

## 2021-05-14 DIAGNOSIS — B3731 Acute candidiasis of vulva and vagina: Secondary | ICD-10-CM | POA: Diagnosis not present

## 2021-05-14 LAB — POCT URINALYSIS DIPSTICK, ED / UC
Bilirubin Urine: NEGATIVE
Glucose, UA: NEGATIVE mg/dL
Hgb urine dipstick: NEGATIVE
Ketones, ur: NEGATIVE mg/dL
Nitrite: NEGATIVE
Protein, ur: NEGATIVE mg/dL
Specific Gravity, Urine: 1.02 (ref 1.005–1.030)
Urobilinogen, UA: 0.2 mg/dL (ref 0.0–1.0)
pH: 7 (ref 5.0–8.0)

## 2021-05-14 MED ORDER — TERCONAZOLE 0.4 % VA CREA: TOPICAL_CREAM | VAGINAL | 0 refills | Status: DC

## 2021-05-14 MED ORDER — FLUCONAZOLE 150 MG PO TABS: ORAL_TABLET | ORAL | 0 refills | Status: DC

## 2021-05-14 NOTE — Discharge Instructions (Signed)
You are being treated for vaginal yeast infection.  Please take 1 tablet of Diflucan today and take the second tablet 3 days later.  I have also provided you with a prescription for an antiyeast medication called terconazole, you can apply 4 times daily as needed for comfort.  The swab that you provided today will be sent to the lab for testing, once the results are received he will be notified and if any additional treatment is needed you will be provided with this as well.

## 2021-05-14 NOTE — ED Provider Notes (Signed)
[facility]    CSN: [id number] Arrival date & time: 05/14/21  1832      History   Chief Complaint Chief Complaint  Patient presents with   Vaginal Itching    HPI Mallory Mcintyre is a 20 y.o. female.   Patient reports chlamydia infection a month ago, states she took the medications as prescribed and now is having a lot of itching in her "private parts".  Patient states she also has her period right now so she is unsure if she is having any vaginal discharge.  Patient denies pelvic pressure, pelvic pain, fever, aches, chills.  The history is provided by the patient.  Vaginal Itching This is a new problem.   Past Medical History:  Diagnosis Date   Encounter for supervision of normal pregnancy, antepartum 10/20/2018    Nursing Staff Provider Office Location  [facility] Dating   LMP Language   English  Anatomy US   normal female  Flu Vaccine   Declined 10/20/2018 Genetic Screen  NIPS: low risk  AFP:  Negative   TDaP vaccine  Declined 04-26-19 Hgb A1C or  GTT   Third trimester 1hr GTT 79 Rhogam     LAB RESULTS  Feeding Plan  Breast Blood Type B/Positive/-- (03/10 1535)  Contraception  Undecided 04-26-19 Antibody Negati   Scoliosis     Patient Active Problem List   Diagnosis Date Noted   Alpha thalassemia silent carrier 11/03/2018   Marijuana use 10/21/2018   Scoliosis 11/26/2013    Past Surgical History:  Procedure Laterality Date   SPINAL FUSION  08/12/2013    OB History     Gravida  1   Para  1   Term  1   Preterm  0   AB  0   Living  1      SAB  0   IAB  0   Ectopic  0   Multiple  0   Live Births  1        Obstetric Comments  LMP: 03/14/2021          Home Medications    Prior to Admission medications   Medication Sig Start Date End Date Taking? Authorizing Provider  terconazole (TERAZOL 7) 0.4 % vaginal cream To affected area up to 4 times daily as needed. 05/14/21  Yes Theadora Rama Scales, PA-C  doxycycline (VIBRAMYCIN) 100 MG  capsule Take 1 capsule (100 mg total) by mouth 2 (two) times daily. 04/25/21   Rushie Chestnut, PA-C  fluconazole (DIFLUCAN) 150 MG tablet Take 1 tablet today.  Take second tablet 3 days later. 05/14/21   Theadora Rama Scales, PA-C  metroNIDAZOLE (FLAGYL) 500 MG tablet Take 1 tablet (500 mg total) by mouth 2 (two) times daily. Avoid alcohol while taking this medication and for 2 days after 04/22/21   Rhys Martini, PA-C  ondansetron [facility] ODT) 8 MG disintegrating tablet Take 1 tablet (8 mg total) by mouth every 8 (eight) hours as needed for nausea or vomiting. 04/22/21   Rhys Martini, PA-C    Family History Family History  Problem Relation Age of Onset   Diabetes Other    Asthma Other    Hypertension Other     Social History Social History   Tobacco Use   Smoking status: Former    Types: Cigars   Smokeless tobacco: Never   Tobacco comments:    2-3 Black n mild daily   Vaping Use   Vaping Use: Never used  Substance Use Topics   Alcohol use: No   Drug use: Not Currently    Types: Marijuana    Comment: last smoked June 2020     Allergies   Patient has no known allergies.   Review of Systems Review of Systems Pertinent findings noted in history of present illness.    Physical Exam Triage Vital Signs ED Triage Vitals  Enc Vitals Group     BP 05/14/21 1935 111/77     Pulse Rate 05/14/21 1935 64     Resp 05/14/21 1935 17     Temp 05/14/21 1935 98.5 F (36.9 C)     Temp Source 05/14/21 1935 Oral     SpO2 05/14/21 1935 99 %     Weight --      Height --      Head Circumference --      Peak Flow --      Pain Score 05/14/21 1934 0     Pain Loc --      Pain Edu? --      Excl. in GC? --    No data found.  Updated Vital Signs BP 111/77 (BP Location: Left Arm)   Pulse 64   Temp 98.5 F (36.9 C) (Oral)   Resp 17   LMP 05/06/2021   SpO2 99%   Visual Acuity Right Eye Distance:   Left Eye Distance:   Bilateral Distance:    Right Eye Near:   Left Eye  Near:    Bilateral Near:     Physical Exam Vitals and nursing note reviewed.  Constitutional:      Appearance: Normal appearance.  HENT:     Head: Normocephalic and atraumatic.     Right Ear: Tympanic membrane, ear canal and external ear normal.     Left Ear: Tympanic membrane, ear canal and external ear normal.     Nose: Nose normal.     Mouth/Throat:     Mouth: Mucous membranes are moist.     Pharynx: Oropharynx is clear.  Eyes:     Extraocular Movements: Extraocular movements intact.     Conjunctiva/sclera: Conjunctivae normal.     Pupils: Pupils are equal, round, and reactive to light.  Cardiovascular:     Rate and Rhythm: Normal rate and regular rhythm.     Pulses: Normal pulses.     Heart sounds: Normal heart sounds.  Pulmonary:     Effort: Pulmonary effort is normal.     Breath sounds: Normal breath sounds.  Abdominal:     General: Abdomen is flat. Bowel sounds are normal.     Palpations: Abdomen is soft.  Genitourinary:    Comments: Patient politely declines GU exam, patient provided a self vaginal swab for testing Musculoskeletal:        General: Normal range of motion.     Cervical back: Normal range of motion and neck supple.  Skin:    General: Skin is warm and dry.  Neurological:     General: No focal deficit present.     Mental Status: She is alert and oriented to person, place, and time. Mental status is at baseline.  Psychiatric:        Mood and Affect: Mood normal.        Behavior: Behavior normal.     UC Treatments / Results  Labs (all labs ordered are listed, but only abnormal results are displayed) Labs Reviewed  POCT URINALYSIS DIPSTICK, ED / UC - Abnormal; Notable for the following components:  Result Value   Leukocytes,Ua TRACE (*)    All other components within normal limits  URINE CULTURE  CERVICOVAGINAL ANCILLARY ONLY    EKG   Radiology No results found.  Procedures Procedures (including critical care time)  Medications  Ordered in UC Medications - No data to display  Initial Impression / Assessment and Plan / UC Course  I have reviewed the triage vital signs and the nursing notes.  Pertinent labs & imaging results that were available during my care of the patient were reviewed by me and considered in my medical decision making (see chart for details).     Given patient's history and symptoms, I will treat her empirically for vaginal yeast infection with Diflucan and terconazole cream.  Patient advised that we will notify her with the results of her vaginal swab once they are received, and that she will be provided with treatment if needed. Final Clinical Impressions(s) / UC Diagnoses   Final diagnoses:  Vaginal candida     Discharge Instructions      You are being treated for vaginal yeast infection.  Please take 1 tablet of Diflucan today and take the second tablet 3 days later.  I have also provided you with a prescription for an antiyeast medication called terconazole, you can apply 4 times daily as needed for comfort.  The swab that you provided today will be sent to the lab for testing, once the results are received he will be notified and if any additional treatment is needed you will be provided with this as well.     ED Prescriptions     Medication Sig Dispense Auth. Provider   fluconazole (DIFLUCAN) 150 MG tablet Take 1 tablet today.  Take second tablet 3 days later. 2 tablet Theadora Rama Scales, PA-C   terconazole (TERAZOL 7) 0.4 % vaginal cream To affected area up to 4 times daily as needed. 45 g Theadora Rama Scales, PA-C      PDMP not reviewed this encounter.   Theadora Rama Scales, [place] 05/14/21 2042

## 2021-05-14 NOTE — ED Triage Notes (Signed)
Pt c/o vaginal itching and burning for a couple days. Reports was treated for STDs and yeast last month.

## 2021-05-15 LAB — URINE CULTURE

## 2021-05-15 LAB — CERVICOVAGINAL ANCILLARY ONLY
Bacterial Vaginitis (gardnerella): NEGATIVE
Candida Glabrata: NEGATIVE
Candida Vaginitis: POSITIVE — AB
Chlamydia: NEGATIVE
Comment: NEGATIVE
Comment: NEGATIVE
Comment: NEGATIVE
Comment: NEGATIVE
Comment: NEGATIVE
Comment: NORMAL
Neisseria Gonorrhea: NEGATIVE
Trichomonas: NEGATIVE

## 2021-06-24 NOTE — Progress Notes (Signed)
Erroneous encounter

## 2021-06-29 ENCOUNTER — Encounter: Payer: Medicaid Other | Admitting: Family

## 2021-06-29 DIAGNOSIS — Z7689 Persons encountering health services in other specified circumstances: Secondary | ICD-10-CM

## 2021-07-16 ENCOUNTER — Encounter (HOSPITAL_COMMUNITY): Payer: Self-pay

## 2021-07-16 ENCOUNTER — Other Ambulatory Visit: Payer: Self-pay

## 2021-07-16 ENCOUNTER — Ambulatory Visit (HOSPITAL_COMMUNITY)
Admission: EM | Admit: 2021-07-16 | Discharge: 2021-07-16 | Disposition: A | Discharge status: Home / Self Care | Payer: Medicaid Other | Attending: Internal Medicine | Admitting: Internal Medicine

## 2021-07-16 DIAGNOSIS — Z113 Encounter for screening for infections with a predominantly sexual mode of transmission: Secondary | ICD-10-CM | POA: Diagnosis present

## 2021-07-16 DIAGNOSIS — Z3202 Encounter for pregnancy test, result negative: Secondary | ICD-10-CM | POA: Diagnosis present

## 2021-07-16 DIAGNOSIS — N3000 Acute cystitis without hematuria: Secondary | ICD-10-CM

## 2021-07-16 LAB — POCT URINALYSIS DIPSTICK, ED / UC
Bilirubin Urine: NEGATIVE
Glucose, UA: NEGATIVE mg/dL
Ketones, ur: 15 mg/dL — AB
Nitrite: POSITIVE — AB
Protein, ur: NEGATIVE mg/dL
Specific Gravity, Urine: 1.02 (ref 1.005–1.030)
Urobilinogen, UA: 0.2 mg/dL (ref 0.0–1.0)
pH: 6 (ref 5.0–8.0)

## 2021-07-16 LAB — HIV ANTIBODY (ROUTINE TESTING W REFLEX): HIV Screen 4th Generation wRfx: NONREACTIVE

## 2021-07-16 LAB — POC URINE PREG, ED: Preg Test, Ur: NEGATIVE

## 2021-07-16 MED ORDER — NITROFURANTOIN MONOHYD MACRO 100 MG PO CAPS: 100.0000 mg | ORAL_CAPSULE | Freq: Two times a day (BID) | ORAL | 0 refills | Status: AC

## 2021-07-16 MED ORDER — PHENAZOPYRIDINE HCL 200 MG PO TABS: 200.0000 mg | ORAL_TABLET | Freq: Three times a day (TID) | ORAL | 0 refills | Status: AC

## 2021-07-16 NOTE — Discharge Instructions (Addendum)
Your urine dipstick was positive for a urinary tract infection. Please take all of the antibiotics, do not stop taking them just because you feel better. You may take pyridium for up to two days for symptom relief.  Urine culture was completed. We will only call if changes need to be made to your antibiotics. Please drink plenty of water.

## 2021-07-16 NOTE — ED Triage Notes (Signed)
Pt presents for UTI, STD and pregnancy screening.   States she has pain in her lower back and c/o dark urine.

## 2021-07-16 NOTE — ED Provider Notes (Signed)
[facility]    CSN: [id number] Arrival date & time: 07/16/21  1628      History   Chief Complaint Chief Complaint  Patient presents with   Urinary Tract Infection   SEXUALLY TRANSMITTED DISEASE   Possible Pregnancy    HPI Mallory Mcintyre is a 20 y.o. female with cc of dysuria over the past 2 days. She reports last UTI was a "long time ago." Has low back pain chronically from scolisos, but denies any worsening sx and denies flank pain. No fever. No visible hematuria. She denies pelvic pain. She does have some discharge but denies itching or odor. She has not tried any OTC meds. She would like a full STI panel, but denies any known exposures. She is requesting a pregnancy test, her last normal menstrual period was on 07/05/21. She is not currently on contraception.    Urinary Tract Infection Possible Pregnancy   Past Medical History:  Diagnosis Date   Encounter for supervision of normal pregnancy, antepartum 10/20/2018    Nursing Staff Provider Office Location  [facility] Dating   LMP Language   English  Anatomy US   normal female  Flu Vaccine   Declined 10/20/2018 Genetic Screen  NIPS: low risk  AFP:  Negative   TDaP vaccine  Declined 04-26-19 Hgb A1C or  GTT   Third trimester 1hr GTT 79 Rhogam     LAB RESULTS  Feeding Plan  Breast Blood Type B/Positive/-- (03/10 1535)  Contraception  Undecided 04-26-19 Antibody Negati   Scoliosis     Patient Active Problem List   Diagnosis Date Noted   Alpha thalassemia silent carrier 11/03/2018   Marijuana use 10/21/2018   Scoliosis 11/26/2013    Past Surgical History:  Procedure Laterality Date   SPINAL FUSION  08/12/2013    OB History     Gravida  1   Para  1   Term  1   Preterm  0   AB  0   Living  1      SAB  0   IAB  0   Ectopic  0   Multiple  0   Live Births  1        Obstetric Comments  LMP: 03/14/2021          Home Medications    Prior to Admission medications   Medication Sig Start Date End  Date Taking? Authorizing Provider  nitrofurantoin, macrocrystal-monohydrate, (MACROBID) 100 MG capsule Take 1 capsule (100 mg total) by mouth 2 (two) times daily for 5 days. 07/16/21 07/21/21 Yes Azizah Lisle L, PA  phenazopyridine (PYRIDIUM) 200 MG tablet Take 1 tablet (200 mg total) by mouth 3 (three) times daily for 2 days. 07/16/21 07/18/21 Yes Charels Stambaugh L, PA  ondansetron (ZOFRAN ODT) 8 MG disintegrating tablet Take 1 tablet (8 mg total) by mouth every 8 (eight) hours as needed for nausea or vomiting. 04/22/21   Rhys Martini, PA-C    Family History Family History  Problem Relation Age of Onset   Diabetes Other    Asthma Other    Hypertension Other     Social History Social History   Tobacco Use   Smoking status: Former    Types: Cigars   Smokeless tobacco: Never   Tobacco comments:    2-3 Black n mild daily   Vaping Use   Vaping Use: Never used  Substance Use Topics   Alcohol use: No   Drug use: Not Currently    Types:  Marijuana    Comment: last smoked June 2020     Allergies   Patient has no known allergies.   Review of Systems Review of Systems As per HPI  Physical Exam Triage Vital Signs ED Triage Vitals  Enc Vitals Group     BP 07/16/21 1744 99/68     Pulse Rate 07/16/21 1744 78     Resp 07/16/21 1744 20     Temp 07/16/21 1744 98.2 F (36.8 C)     Temp Source 07/16/21 1744 Oral     SpO2 07/16/21 1744 96 %     Weight --      Height --      Head Circumference --      Peak Flow --      Pain Score 07/16/21 1743 3     Pain Loc --      Pain Edu? --      Excl. in [place]? --    No data found.  Updated Vital Signs BP 99/68 (BP Location: Left Arm)   Pulse 78   Temp 98.2 F (36.8 C) (Oral)   Resp 20   LMP 07/05/2021 (Exact Date)   SpO2 96%   Visual Acuity Right Eye Distance:   Left Eye Distance:   Bilateral Distance:    Right Eye Near:   Left Eye Near:    Bilateral Near:     Physical Exam Vitals and nursing note reviewed.   Constitutional:      Appearance: Normal appearance. She is normal weight.  HENT:     Head: Normocephalic and atraumatic.     Mouth/Throat:     Mouth: Mucous membranes are moist.     Pharynx: Oropharynx is clear.  Eyes:     Pupils: Pupils are equal, round, and reactive to light.  Cardiovascular:     Rate and Rhythm: Normal rate and regular rhythm.  Pulmonary:     Effort: Pulmonary effort is normal.  Abdominal:     General: Abdomen is flat. Bowel sounds are normal. There is no distension.     Palpations: Abdomen is soft. There is no mass.     Tenderness: There is no abdominal tenderness. There is no right CVA tenderness, left CVA tenderness, guarding or rebound.  Musculoskeletal:        General: Normal range of motion.     Comments: Lumbar spine scar  Neurological:     Mental Status: She is alert.  Psychiatric:        Mood and Affect: Mood normal.        Thought Content: Thought content normal.     UC Treatments / Results  Labs (all labs ordered are listed, but only abnormal results are displayed) Labs Reviewed  POCT URINALYSIS DIPSTICK, ED / UC - Abnormal; Notable for the following components:      Result Value   Ketones, ur 15 (*)    Hgb urine dipstick TRACE (*)    Nitrite POSITIVE (*)    Leukocytes,Ua LARGE (*)    All other components within normal limits  URINE CULTURE  RPR  HIV ANTIBODY (ROUTINE TESTING W REFLEX)  POC URINE PREG, ED  CERVICOVAGINAL ANCILLARY ONLY    EKG   Radiology No results found.  Procedures Procedures (including critical care time)  Medications Ordered in UC Medications - No data to display  Initial Impression / Assessment and Plan / UC Course  I have reviewed the triage vital signs and the nursing notes.  Pertinent labs & imaging  results that were available during my care of the patient were reviewed by me and considered in my medical decision making (see chart for details).     Urinary tract infection - pyridium and  macrobid.  Pregnancy test negative STI panel completed per pt request, will call with results and tx any positives.  Final Clinical Impressions(s) / UC Diagnoses   Final diagnoses:  Acute cystitis without hematuria  Screening examination for sexually transmitted disease  Negative pregnancy test     Discharge Instructions      Your urine dipstick was positive for a urinary tract infection. Please take all of the antibiotics, do not stop taking them just because you feel better. You may take pyridium for up to two days for symptom relief.  Urine culture was completed. We will only call if changes need to be made to your antibiotics. Please drink plenty of water.    ED Prescriptions     Medication Sig Dispense Auth. Provider   nitrofurantoin, macrocrystal-monohydrate, (MACROBID) 100 MG capsule Take 1 capsule (100 mg total) by mouth 2 (two) times daily for 5 days. 10 capsule Arnold Kester L, PA   phenazopyridine (PYRIDIUM) 200 MG tablet Take 1 tablet (200 mg total) by mouth 3 (three) times daily for 2 days. 6 tablet Akeem Heppler L, [place]      PDMP not reviewed this encounter.   Chaney Malling, [place] 07/16/21 2051

## 2021-07-17 LAB — RPR: RPR Ser Ql: NONREACTIVE

## 2021-07-17 LAB — CERVICOVAGINAL ANCILLARY ONLY
Bacterial Vaginitis (gardnerella): NEGATIVE
Candida Glabrata: NEGATIVE
Candida Vaginitis: POSITIVE — AB
Chlamydia: NEGATIVE
Comment: NEGATIVE
Comment: NEGATIVE
Comment: NEGATIVE
Comment: NEGATIVE
Comment: NEGATIVE
Comment: NORMAL
Neisseria Gonorrhea: NEGATIVE
Trichomonas: NEGATIVE

## 2021-07-18 ENCOUNTER — Telehealth (HOSPITAL_COMMUNITY): Payer: Self-pay | Admitting: Emergency Medicine

## 2021-07-18 MED ORDER — FLUCONAZOLE 150 MG PO TABS: 150.0000 mg | ORAL_TABLET | Freq: Once | ORAL | 0 refills | Status: AC

## 2021-07-20 LAB — URINE CULTURE: Culture: >=100000 — AB

## 2021-09-05 ENCOUNTER — Encounter: Payer: Self-pay | Admitting: Family

## 2021-09-05 ENCOUNTER — Ambulatory Visit (INDEPENDENT_AMBULATORY_CARE_PROVIDER_SITE_OTHER): Payer: Commercial Managed Care - PPO | Admitting: Family

## 2021-09-05 ENCOUNTER — Other Ambulatory Visit: Payer: Self-pay

## 2021-09-05 VITALS — BP 113/73 | HR 79 | Temp 97.7°F | Ht 65.0 in | Wt 144.6 lb

## 2021-09-05 DIAGNOSIS — Z981 Arthrodesis status: Secondary | ICD-10-CM

## 2021-09-05 DIAGNOSIS — M2550 Pain in unspecified joint: Secondary | ICD-10-CM | POA: Insufficient documentation

## 2021-09-05 DIAGNOSIS — Z8739 Personal history of other diseases of the musculoskeletal system and connective tissue: Secondary | ICD-10-CM | POA: Diagnosis not present

## 2021-09-05 LAB — CBC WITH DIFFERENTIAL/PLATELET
Basophils Absolute: 0.1 10*3/uL (ref 0.0–0.1)
Basophils Relative: 0.9 % (ref 0.0–3.0)
Eosinophils Absolute: 0.3 10*3/uL (ref 0.0–0.7)
Eosinophils Relative: 3.5 % (ref 0.0–5.0)
HCT: 43.6 % (ref 36.0–46.0)
Hemoglobin: 13.9 g/dL (ref 12.0–15.0)
Lymphocytes Relative: 32 % (ref 12.0–46.0)
Lymphs Abs: 2.8 10*3/uL (ref 0.7–4.0)
MCHC: 31.8 g/dL (ref 30.0–36.0)
MCV: 94.9 fl (ref 78.0–100.0)
Monocytes Absolute: 0.6 10*3/uL (ref 0.1–1.0)
Monocytes Relative: 7 % (ref 3.0–12.0)
Neutro Abs: 5 10*3/uL (ref 1.4–7.7)
Neutrophils Relative %: 56.6 % (ref 43.0–77.0)
Platelets: 309 10*3/uL (ref 150.0–400.0)
RBC: 4.59 Mil/uL (ref 3.87–5.11)
RDW: 13.3 % (ref 11.5–15.5)
WBC: 8.8 10*3/uL (ref 4.0–10.5)

## 2021-09-05 LAB — C-REACTIVE PROTEIN: CRP: <1 mg/dL (ref 0.5–20.0)

## 2021-09-05 LAB — SEDIMENTATION RATE: Sed Rate: 28 mm/hr — ABNORMAL HIGH (ref 0–20)

## 2021-09-05 MED ORDER — CELECOXIB 100 MG PO CAPS: 100.0000 mg | ORAL_CAPSULE | Freq: Two times a day (BID) | ORAL | 0 refills | Status: DC

## 2021-09-05 NOTE — Patient Instructions (Signed)
Welcome to [facility]! It was a pleasure meeting you today.  As discussed, go to the lab for blood work today. I have sent a medication, Celebrex, to help with your pain, start with 1 pill twice a day and then you can  increase to 2 pills twice a day if needed.   Please schedule a 1 month follow up visit today.    PLEASE NOTE:  If you had any LAB tests please let us know if you have not heard back within a few days. You may see your results on MyChart before we have a chance to review them but we will give you a call once they are reviewed by [place]. If we ordered any REFERRALS today, please let us know if you have not heard from their office within the next week.  Let us know through MyChart if you are needing REFILLS, or have your pharmacy send [place] the request. You can also use MyChart to communicate with me or any office staff.  Please try these tips to maintain a healthy lifestyle:  Eat most of your calories during the day when you are active. Eliminate processed foods including packaged sweets (pies, cakes, cookies), reduce intake of potatoes, white bread, white pasta, and white rice. Look for whole grain options, oat flour or almond flour.  Each meal should contain half fruits/vegetables, one quarter protein, and one quarter carbs (no bigger than a computer mouse).  Cut down on sweet beverages. This includes juice, soda, and sweet tea. Also watch fruit intake, though this is a healthier sweet option, it still contains natural sugar! Limit to 3 servings daily.  Drink at least 1 glass of water with each meal and aim for at least 8 glasses per day  Exercise at least 150 minutes every week.

## 2021-09-05 NOTE — Progress Notes (Signed)
New Patient Office Visit  Subjective:  Patient ID: Mallory Mcintyre, female    DOB: [birth year]  Age: 21 y.o. MRN: [id number]  CC:  Chief Complaint  Patient presents with   Establish Care   Joint Pain    Starting a couple of years ago. Post Spinal fusion surgery. She has taken ibuprofen, and Tylenol but still has not helped.    HPI Mallory Mcintyre presents for establishing care and to discuss one problem. Pain She reports new onset bilateral ankle, wrist, knee pain. There was not an injury that may have caused the pain. The pain started about a year ago and is gradually worsening. The pain does not radiate. The pain is described as aching, soreness, and stiffness, is moderate in intensity, occurring intermittently. 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 She has tried application of heat, acetaminophen, and NSAIDs with little relief.    Past Medical History:  Diagnosis Date   Encounter for supervision of normal pregnancy, antepartum 10/20/2018    Nursing Staff Provider Office Location  [facility] Dating   LMP Language   English  Anatomy US   normal female  Flu Vaccine   Declined 10/20/2018 Genetic Screen  NIPS: low risk  AFP:  Negative   TDaP vaccine  Declined 04-26-19 Hgb A1C or  GTT   Third trimester 1hr GTT 79 Rhogam     LAB RESULTS  Feeding Plan  Breast Blood Type B/Positive/-- (03/10 1535)  Contraception  Undecided 04-26-19 Antibody Negati   Scoliosis     Past Surgical History:  Procedure Laterality Date   SPINAL FUSION  08/12/2013    Family History  Problem Relation Age of Onset   Heart disease Maternal Grandmother    Drug abuse Maternal Grandfather    COPD Maternal Grandfather    Alcohol abuse Maternal Grandfather    Diabetes Other    Asthma Other    Hypertension Other     Social History   Socioeconomic History   Marital status: Single    Spouse name: Not on file   Number of  children: Not on file   Years of education: Not on file   Highest education level: Not on file  Occupational History   Not on file  Tobacco Use   Smoking status: Former    Types: Cigars   Smokeless tobacco: Never   Tobacco comments:    2-3 Black n mild daily   Vaping Use   Vaping Use: Never used  Substance and Sexual Activity   Alcohol use: No   Drug use: Not Currently    Types: Marijuana    Comment: last smoked June 2020   Sexual activity: Yes    Partners: Male    Birth control/protection: None  Other Topics Concern   Not on file  Social History Narrative   Not on file   Social Determinants of Health   Financial Resource Strain: Not on file  Food Insecurity: Not on file  Transportation Needs: Not on file  Physical Activity: Not on file  Stress: Not on file  Social Connections: Not on file  Intimate Partner Violence: Not on file    Objective:   Today's Vitals: BP 113/73    Pulse 79    Temp 97.7 F (36.5 C) (Temporal)    Ht 5\' 5"  (1.651 m)    Wt 144 lb 9.6 oz (65.6 kg)    SpO2 98%  BMI 24.06 kg/m   Physical Exam Vitals and nursing note reviewed.  Constitutional:      Appearance: Normal appearance.  Cardiovascular:     Rate and Rhythm: Normal rate and regular rhythm.  Pulmonary:     Effort: Pulmonary effort is normal.     Breath sounds: Normal breath sounds.  Musculoskeletal:        General: Normal range of motion.  Skin:    General: Skin is warm and dry.  Neurological:     Mental Status: She is alert and oriented to person, place, and time.     Cranial Nerves: Cranial nerves 2-12 are intact.     Sensory: Sensation is intact.     Motor: Weakness (bilateral legs) present.     Gait: Gait is intact.  Psychiatric:        Mood and Affect: Mood normal.        Behavior: Behavior normal.    Assessment & Plan:   Problem List Items Addressed This Visit       Other   History of spinal fusion for scoliosis    2015 [facility] hospital did original surgery.  re-examined 3 mos ago w/ [facility] and told that all was good, but did have a mild bulging disc.       Multiple joint pain - Primary    New - bilateral ankles, elbows, knees, wrists - states hips & shoulders are not as bad, but bothersome some times, reports paresthesias in both feet and hands pt has hx of chronic back pain r/t scoliosis, spinal fusion in 2017 and started having worsening pain last fall, seen by [facility] surgeon office and had new imaging completed which showed a mild bulging disc. They were going to order blood work per pt, but she never followed back up.       Relevant Medications   celecoxib (CELEBREX) 100 MG capsule   Other Relevant Orders   Autoimmune Profile   Sedimentation rate   C-reactive protein   Jo-1 antibody-IgG   Rheumatoid factor   Sickle cell screen   CBC with Differential/Platelet   Autoimmune Neurology Ab   Extractable Nuclear antigen ab    Outpatient Encounter Medications as of 09/05/2021  Medication Sig   celecoxib (CELEBREX) 100 MG capsule Take 1 capsule (100 mg total) by mouth 2 (two) times daily.   ondansetron (ZOFRAN ODT) 8 MG disintegrating tablet Take 1 tablet (8 mg total) by mouth every 8 (eight) hours as needed for nausea or vomiting. (Patient not taking: Reported on 09/05/2021)   No facility-administered encounter medications on file as of 09/05/2021.    Follow-up: Return in about 4 weeks (around 10/03/2021) for joint pain.   Jeanie Sewer, NP

## 2021-09-05 NOTE — Assessment & Plan Note (Addendum)
New - bilateral ankles, elbows, knees, wrists - states hips & shoulders are not as bad, but bothersome some times, reports paresthesias in both feet and hands pt has hx of chronic back pain r/t scoliosis, spinal fusion in 2017 and started having worsening pain last fall, seen by [facility] surgeon office and had new imaging completed which showed a mild bulging disc. They were going to order blood work per pt, but she never followed back up.

## 2021-09-05 NOTE — Assessment & Plan Note (Signed)
58 [facility] hospital did original surgery. re-examined 3 mos ago w/ [facility] and told that all was good, but did have a mild bulging disc.

## 2021-09-05 NOTE — Addendum Note (Signed)
Addended by: Lorn Junes on: 09/05/2021 12:57 PM   Modules accepted: Orders

## 2021-09-06 LAB — JO-1 ANTIBODY-IGG: Jo-1 Autoabs: <1 AI

## 2021-09-06 LAB — RHEUMATOID FACTOR: Rheumatoid fact SerPl-aCnc: <14 IU/mL (ref <14)

## 2021-09-06 LAB — SICKLE CELL SCREEN: Sickle Solubility Test - HGBRFX: NEGATIVE

## 2021-09-13 ENCOUNTER — Encounter: Payer: Self-pay | Admitting: Family

## 2021-09-15 LAB — AUTOIMMUNE NEUROLOGY AB
AGNA-1: NEGATIVE
AMPA-R1 Antibody: NEGATIVE
AMPA-R2 Antibody: NEGATIVE
Amphiphysin Antibody: NEGATIVE
Anti-Hu Ab: NEGATIVE
Anti-Ri Ab: NEGATIVE
Anti-Yo Ab: NEGATIVE
Antineruonal nuclear Ab Type 3: NEGATIVE
Aquaporin 4 Antibody: NEGATIVE
CASPR2 Antibody,Cell-based IFA: NEGATIVE
CRMP-5 IgG: NEGATIVE
DNER Antibody: NEGATIVE
DPPX Antibody: NEGATIVE
GABA-B-R Antibody: NEGATIVE
GAD65 Antibody: NEGATIVE
ITPR1 Antibody: NEGATIVE
IgLON5 Antibody: NEGATIVE
Interpretation: NEGATIVE
LGI1 Antibody, Cell-based IFA: NEGATIVE
Ma2/Ta Antibody: NEGATIVE
NMDA-R Antibody: NEGATIVE
Purkinje Cell Cyto Ab Type 2: NEGATIVE
Purkinje Cell Cyto Ab Type Tr: NEGATIVE
VGCC Antibody: 2.5 pmol/L (ref 0.0–30.0)
Zic4 Antibody: NEGATIVE
mGluR1 Antibody: NEGATIVE

## 2021-09-15 LAB — EXTRACTABLE NUCLEAR ANTIGEN ANTIBODY
ENA RNP Ab: 0.9 AI (ref 0.0–0.9)
ENA SM Ab Ser-aCnc: <0.2 AI (ref 0.0–0.9)
ENA SSA (RO) Ab: <0.2 AI (ref 0.0–0.9)
ENA SSB (LA) Ab: <0.2 AI (ref 0.0–0.9)
Scleroderma (Scl-70) (ENA) Antibody, IgG: <0.2 AI (ref 0.0–0.9)
dsDNA Ab: <1 IU/mL (ref 0–9)

## 2021-09-15 LAB — AUTOIMMUNE PROFILE
Anti Nuclear Antibody (ANA): NEGATIVE
Complement C3, Serum: 148 mg/dL (ref 82–167)

## 2021-09-25 ENCOUNTER — Ambulatory Visit: Payer: Medicaid Other | Admitting: Family

## 2021-10-04 ENCOUNTER — Ambulatory Visit: Payer: Commercial Managed Care - PPO | Admitting: Family

## 2021-10-10 ENCOUNTER — Ambulatory Visit: Payer: Commercial Managed Care - PPO | Admitting: Family

## 2021-11-12 ENCOUNTER — Ambulatory Visit (HOSPITAL_COMMUNITY)
Admission: EM | Admit: 2021-11-12 | Discharge: 2021-11-12 | Disposition: A | Discharge status: Home / Self Care | Payer: Commercial Managed Care - PPO | Attending: Family Medicine | Admitting: Family Medicine

## 2021-11-12 ENCOUNTER — Encounter (HOSPITAL_COMMUNITY): Payer: Self-pay | Admitting: Emergency Medicine

## 2021-11-12 ENCOUNTER — Other Ambulatory Visit: Payer: Self-pay

## 2021-11-12 DIAGNOSIS — L03317 Cellulitis of buttock: Secondary | ICD-10-CM | POA: Insufficient documentation

## 2021-11-12 DIAGNOSIS — N926 Irregular menstruation, unspecified: Secondary | ICD-10-CM | POA: Insufficient documentation

## 2021-11-12 LAB — POCT URINALYSIS DIPSTICK, ED / UC
Bilirubin Urine: NEGATIVE
Glucose, UA: NEGATIVE mg/dL
Hgb urine dipstick: NEGATIVE
Ketones, ur: NEGATIVE mg/dL
Leukocytes,Ua: NEGATIVE
Nitrite: NEGATIVE
Protein, ur: NEGATIVE mg/dL
Specific Gravity, Urine: 1.02 (ref 1.005–1.030)
Urobilinogen, UA: 0.2 mg/dL (ref 0.0–1.0)
pH: 7.5 (ref 5.0–8.0)

## 2021-11-12 LAB — POC URINE PREG, ED: Preg Test, Ur: NEGATIVE

## 2021-11-12 MED ORDER — AMOXICILLIN-POT CLAVULANATE 875-125 MG PO TABS: 1.0000 | ORAL_TABLET | Freq: Two times a day (BID) | ORAL | 0 refills | Status: AC

## 2021-11-12 NOTE — ED Provider Notes (Signed)
?[facility] ? ? ? ?CSN: [id number] ?Arrival date & time: 11/12/21  1438 ? ? ?  ? ?History   ?Chief Complaint ?Chief Complaint  ?Patient presents with  ? Abscess  ? ? ?HPI ?Mallory Mcintyre is a 21 y.o. female.  ? ? ?Abscess ?Here for a sore area on her left perineum, which has come and gone for a few months.  With her menses it can increase in size, and she has gotten pus out of it before.  Currently is not bothering her very much.  No fever or chills or dysuria ? ?She did have a normal menses March 12 through 17.  Then she spotted for a few days and then stopped.  Since then she has had a little cramping in her lower abdomen. ? ? ? ?Past Medical History:  ?Diagnosis Date  ? Encounter for supervision of normal pregnancy, antepartum 10/20/2018  ?  Nursing Staff Provider Office Location  [facility] Dating   LMP Language   English  Anatomy US   normal female  Flu Vaccine   Declined 10/20/2018 Genetic Screen  NIPS: low risk  AFP:  Negative   TDaP vaccine  Declined 04-26-19 Hgb A1C or  GTT   Third trimester 1hr GTT 79 Rhogam     LAB RESULTS  Feeding Plan  Breast Blood Type B/Positive/-- (03/10 1535)  Contraception  Undecided 04-26-19 Antibody Negati  ? Scoliosis   ? ? ?Patient Active Problem List  ? Diagnosis Date Noted  ? History of spinal fusion for scoliosis 09/05/2021  ? Multiple joint pain 09/05/2021  ? Alpha thalassemia silent carrier 11/03/2018  ? Marijuana use 10/21/2018  ? Scoliosis 11/26/2013  ? ? ?Past Surgical History:  ?Procedure Laterality Date  ? SPINAL FUSION  08/12/2013  ? ? ?OB History   ? ? Gravida  ?1  ? Para  ?1  ? Term  ?1  ? Preterm  ?0  ? AB  ?0  ? Living  ?1  ?  ? ? SAB  ?0  ? IAB  ?0  ? Ectopic  ?0  ? Multiple  ?0  ? Live Births  ?1  ?   ?  ? Obstetric Comments  ?LMP: 03/14/2021  ?  ? ?  ? ? ? ?Home Medications   ? ?Prior to Admission medications   ?Medication Sig Start Date End Date Taking? Authorizing Provider  ?amoxicillin-clavulanate (AUGMENTIN) 875-125 MG tablet Take 1 tablet by mouth 2 (two)  times daily for 7 days. 11/12/21 11/19/21 Yes Zenia Resides, MD  ? ? ?Family History ?Family History  ?Problem Relation Age of Onset  ? Heart disease Maternal Grandmother   ? Drug abuse Maternal Grandfather   ? COPD Maternal Grandfather   ? Alcohol abuse Maternal Grandfather   ? Diabetes Other   ? Asthma Other   ? Hypertension Other   ? ? ?Social History ?Social History  ? ?Tobacco Use  ? Smoking status: Some Days  ?  Types: Cigars  ? Smokeless tobacco: Never  ? Tobacco comments:  ?  2-3 Black n mild daily   ?Vaping Use  ? Vaping Use: Never used  ?Substance Use Topics  ? Alcohol use: Yes  ? Drug use: Not Currently  ?  Types: Marijuana  ?  Comment: last smoked June 2020  ? ? ? ?Allergies   ?Patient has no known allergies. ? ? ?Review of Systems ?Review of Systems ? ? ?Physical Exam ?Triage Vital Signs ?ED Triage Vitals  ?Enc Vitals  Group  ?   BP 11/12/21 1726 117/75  ?   Pulse Rate 11/12/21 1726 90  ?   Resp 11/12/21 1726 20  ?   Temp 11/12/21 1726 98.7 ?F (37.1 ?C)  ?   Temp Source 11/12/21 1726 Oral  ?   SpO2 11/12/21 1726 97 %  ?   Weight --   ?   Height --   ?   Head Circumference --   ?   Peak Flow --   ?   Pain Score 11/12/21 1720 0  ?   Pain Loc --   ?   Pain Edu? --   ?   Excl. in [place]? --   ? ?No data found. ? ?Updated Vital Signs ?BP 117/75 (BP Location: Right Arm)   Pulse 90   Temp 98.7 ?F (37.1 ?C) (Oral)   Resp 20   LMP 10/23/2021   SpO2 97%  ? ?Visual Acuity ?Right Eye Distance:   ?Left Eye Distance:   ?Bilateral Distance:   ? ?Right Eye Near:   ?Left Eye Near:    ?Bilateral Near:    ? ?Physical Exam ?Vitals reviewed.  ?Constitutional:   ?   General: She is not in acute distress. ?   Appearance: She is not toxic-appearing.  ?Abdominal:  ?   General: There is no distension.  ?   Palpations: Abdomen is soft. There is no mass.  ?   Tenderness: There is no abdominal tenderness. There is no guarding.  ?Genitourinary: ?   Comments: No cystic structure or fluctuance.  She has an area of induration about  2.5 cm x 1 cm on her left perineum towards the buttock area.  No abnormality of the labia majora ?Skin: ?   Coloration: Skin is not jaundiced or pale.  ?Psychiatric:     ?   Behavior: Behavior normal.  ? ? ? ?UC Treatments / Results  ?Labs ?(all labs ordered are listed, but only abnormal results are displayed) ?Labs Reviewed  ?POCT URINALYSIS DIPSTICK, ED / UC  ?POC URINE PREG, ED  ?CERVICOVAGINAL ANCILLARY ONLY  ? ? ?EKG ? ? ?Radiology ?No results found. ? ?Procedures ?Procedures (including critical care time) ? ?Medications Ordered in UC ?Medications - No data to display ? ?Initial Impression / Assessment and Plan / UC Course  ?I have reviewed the triage vital signs and the nursing notes. ? ?Pertinent labs & imaging results that were available during my care of the patient were reviewed by me and considered in my medical decision making (see chart for details). ? ?  ? ?We will treat for cellulitis.  Request made to help her find a PCP and also contact information for women's health given ?Urinalysis negative and UPT is negative ?Final Clinical Impressions(s) / UC Diagnoses  ? ?Final diagnoses:  ?Cellulitis of buttock  ?Irregular menses  ? ? ? ?Discharge Instructions   ? ?  ? ?Your pregnancy test was negative. ? ? ?Urinalysis was clear. ? ?Staff will call you if ? ?Take amoxicillin-clavulanate 875 mg--1 tab twice daily with food for 7 days  ? ? ? ? ?ED Prescriptions   ? ? Medication Sig Dispense Auth. Provider  ? amoxicillin-clavulanate (AUGMENTIN) 875-125 MG tablet Take 1 tablet by mouth 2 (two) times daily for 7 days. 14 tablet Shellyann Wandrey, Janace Aris, MD  ? ?  ? ?PDMP not reviewed this encounter. ?  ?Zenia Resides, MD ?11/12/21 1828 ? ?

## 2021-11-12 NOTE — Discharge Instructions (Addendum)
?  Your pregnancy test was negative. ? ? ?Urinalysis was clear. ? ?Staff will call you if ? ?Take amoxicillin-clavulanate 875 mg--1 tab twice daily with food for 7 days  ?

## 2021-11-12 NOTE — ED Triage Notes (Signed)
Reports a reoccurring abscess that grows with onset of menses.  Patient currently on menstrual cycle.  Patient had a anticipated period in march.  Approximately one week after end of period, patient did have reoccurrence of  vaginal bleeding ?

## 2021-11-13 LAB — CERVICOVAGINAL ANCILLARY ONLY
Bacterial Vaginitis (gardnerella): POSITIVE — AB
Candida Glabrata: NEGATIVE
Candida Vaginitis: NEGATIVE
Chlamydia: NEGATIVE
Comment: NEGATIVE
Comment: NEGATIVE
Comment: NEGATIVE
Comment: NEGATIVE
Comment: NEGATIVE
Comment: NORMAL
Neisseria Gonorrhea: NEGATIVE
Trichomonas: NEGATIVE

## 2021-11-14 ENCOUNTER — Telehealth (HOSPITAL_COMMUNITY): Payer: Self-pay | Admitting: Emergency Medicine

## 2021-11-14 MED ORDER — METRONIDAZOLE 0.75 % VA GEL: 1.0000 | Freq: Every day | VAGINAL | 0 refills | Status: AC

## 2021-11-15 ENCOUNTER — Encounter (HOSPITAL_COMMUNITY): Payer: Self-pay | Admitting: Emergency Medicine

## 2021-11-15 ENCOUNTER — Ambulatory Visit (HOSPITAL_COMMUNITY)
Admission: EM | Admit: 2021-11-15 | Discharge: 2021-11-15 | Disposition: A | Discharge status: Home / Self Care | Payer: Commercial Managed Care - PPO | Attending: Internal Medicine | Admitting: Internal Medicine

## 2021-11-15 DIAGNOSIS — R112 Nausea with vomiting, unspecified: Secondary | ICD-10-CM | POA: Diagnosis not present

## 2021-11-15 DIAGNOSIS — R197 Diarrhea, unspecified: Secondary | ICD-10-CM

## 2021-11-15 MED ORDER — ONDANSETRON 4 MG PO TBDP: 4.0000 mg | ORAL_TABLET | Freq: Three times a day (TID) | ORAL | 0 refills | Status: DC | PRN

## 2021-11-15 MED ORDER — PANTOPRAZOLE SODIUM 20 MG PO TBEC: 20.0000 mg | DELAYED_RELEASE_TABLET | Freq: Every day | ORAL | 0 refills | Status: DC

## 2021-11-15 NOTE — ED Provider Notes (Signed)
?[facility] ? ? ? ?CSN: [id number] ?Arrival date & time: 11/15/21  1019 ? ? ?  ? ?History   ?Chief Complaint ?Chief Complaint  ?Patient presents with  ? Abdominal Pain  ? Emesis  ? Diarrhea  ? ? ?HPI ?Mallory Mcintyre is a 21 y.o. female comes to urgent care with 2-day history of generalized abdominal pain, nausea and vomiting.  Patient's symptoms started 2 days ago and has been persistent.  She complains of pain in the epigastric region and it is associated with some burning sensation in her chest.  No abdominal distention.  Vomitus is nonbloody and nonbilious.  She started having nonbloody nonmucoid diarrhea this morning.  Patient was recently started on Augmentin for cellulitis of the perineum.  No change in dietary habits.  Patient denies history of gastroesophageal reflux disease or acute gastritis. ?HPI ? ?Past Medical History:  ?Diagnosis Date  ? Encounter for supervision of normal pregnancy, antepartum 10/20/2018  ?  Nursing Staff Provider Office Location  [facility] Dating   LMP Language   English  Anatomy US   normal female  Flu Vaccine   Declined 10/20/2018 Genetic Screen  NIPS: low risk  AFP:  Negative   TDaP vaccine  Declined 04-26-19 Hgb A1C or  GTT   Third trimester 1hr GTT 79 Rhogam     LAB RESULTS  Feeding Plan  Breast Blood Type B/Positive/-- (03/10 1535)  Contraception  Undecided 04-26-19 Antibody Negati  ? Scoliosis   ? ? ?Patient Active Problem List  ? Diagnosis Date Noted  ? History of spinal fusion for scoliosis 09/05/2021  ? Multiple joint pain 09/05/2021  ? Alpha thalassemia silent carrier 11/03/2018  ? Marijuana use 10/21/2018  ? Scoliosis 11/26/2013  ? ? ?Past Surgical History:  ?Procedure Laterality Date  ? SPINAL FUSION  08/12/2013  ? ? ?OB History   ? ? Gravida  ?1  ? Para  ?1  ? Term  ?1  ? Preterm  ?0  ? AB  ?0  ? Living  ?1  ?  ? ? SAB  ?0  ? IAB  ?0  ? Ectopic  ?0  ? Multiple  ?0  ? Live Births  ?1  ?   ?  ? Obstetric Comments  ?LMP: 03/14/2021  ?  ? ?  ? ? ? ?Home Medications    ? ?Prior to Admission medications   ?Medication Sig Start Date End Date Taking? Authorizing Provider  ?metroNIDAZOLE (METROGEL VAGINAL) 0.75 % vaginal gel Place 1 Applicatorful vaginally at bedtime for 5 days. 11/14/21 11/19/21  Merrilee Jansky, MD  ?ondansetron (ZOFRAN-ODT) 4 MG disintegrating tablet Take 1 tablet (4 mg total) by mouth every 8 (eight) hours as needed for nausea or vomiting. 11/15/21  Yes Zeriah Baysinger, Britta Mccreedy, MD  ?pantoprazole (PROTONIX) 20 MG tablet Take 1 tablet (20 mg total) by mouth daily. 11/15/21  Yes Lella Mullany, Britta Mccreedy, MD  ?amoxicillin-clavulanate (AUGMENTIN) 875-125 MG tablet Take 1 tablet by mouth 2 (two) times daily for 7 days. 11/12/21 11/19/21  Zenia Resides, MD  ? ? ?Family History ?Family History  ?Problem Relation Age of Onset  ? Heart disease Maternal Grandmother   ? Drug abuse Maternal Grandfather   ? COPD Maternal Grandfather   ? Alcohol abuse Maternal Grandfather   ? Diabetes Other   ? Asthma Other   ? Hypertension Other   ? ? ?Social History ?Social History  ? ?Tobacco Use  ? Smoking status: Some Days  ?  Types: Cigars  ?  Smokeless tobacco: Never  ? Tobacco comments:  ?  2-3 Black n mild daily   ?Vaping Use  ? Vaping Use: Never used  ?Substance Use Topics  ? Alcohol use: Yes  ? Drug use: Not Currently  ?  Types: Marijuana  ?  Comment: last smoked June 2020  ? ? ? ?Allergies   ?Patient has no known allergies. ? ? ?Review of Systems ?Review of Systems  ?HENT: Negative.    ?Respiratory:  Negative for shortness of breath and stridor.   ?Gastrointestinal:  Positive for abdominal pain, diarrhea, nausea and vomiting. Negative for blood in stool.  ?Genitourinary: Negative.   ?Neurological: Negative.   ? ? ?Physical Exam ?Triage Vital Signs ?ED Triage Vitals  ?Enc Vitals Group  ?   BP 11/15/21 1133 112/74  ?   Pulse Rate 11/15/21 1133 93  ?   Resp 11/15/21 1133 17  ?   Temp 11/15/21 1133 98.6 ?F (37 ?C)  ?   Temp Source 11/15/21 1133 Oral  ?   SpO2 11/15/21 1133 100 %  ?   Weight --   ?    Height --   ?   Head Circumference --   ?   Peak Flow --   ?   Pain Score 11/15/21 1132 7  ?   Pain Loc --   ?   Pain Edu? --   ?   Excl. in GC? --   ? ?No data found. ? ?Updated Vital Signs ?BP 112/74   Pulse 93   Temp 98.6 ?F (37 ?C) (Oral)   Resp 17   LMP 10/23/2021   SpO2 100%  ? ?Visual Acuity ?Right Eye Distance:   ?Left Eye Distance:   ?Bilateral Distance:   ? ?Right Eye Near:   ?Left Eye Near:    ?Bilateral Near:    ? ?Physical Exam ?Vitals and nursing note reviewed.  ?Constitutional:   ?   General: She is not in acute distress. ?   Appearance: She is not ill-appearing.  ?Cardiovascular:  ?   Rate and Rhythm: Normal rate and regular rhythm.  ?   Heart sounds: Normal heart sounds.  ?Pulmonary:  ?   Effort: Pulmonary effort is normal.  ?   Breath sounds: Normal breath sounds.  ?Abdominal:  ?   Palpations: Abdomen is soft. There is no shifting dullness, hepatomegaly or splenomegaly.  ?   Tenderness: There is no abdominal tenderness.  ?   Hernia: No hernia is present.  ?Neurological:  ?   Mental Status: She is alert.  ? ? ? ?UC Treatments / Results  ?Labs ?(all labs ordered are listed, but only abnormal results are displayed) ?Labs Reviewed - No data to display ? ?EKG ? ? ?Radiology ?No results found. ? ?Procedures ?Procedures (including critical care time) ? ?Medications Ordered in UC ?Medications - No data to display ? ?Initial Impression / Assessment and Plan / UC Course  ?I have reviewed the triage vital signs and the nursing notes. ? ?Pertinent labs & imaging results that were available during my care of the patient were reviewed by me and considered in my medical decision making (see chart for details). ? ?  ? ?1.  Abdominal pain with vomiting and diarrhea: ?Abdominal pain is likely secondary to gastritis.  Trial of proton pump inhibitor and Zofran as needed ?Maintain adequate hydration ?Return to urgent care if symptoms worsen ?Diarrhea is likely related to Augmentin recently prescribed.  Probiotics  recommended ?Return precautions given. ?Final Clinical  Impressions(s) / UC Diagnoses  ? ?Final diagnoses:  ?Nausea vomiting and diarrhea  ? ? ? ?Discharge Instructions   ? ?  ?Please take medications as prescribed ?Please take over-the-counter probiotics. ?Increase oral fluid intake ?If you have worsening abdominal pain, bloody vomitus or bowel movements or abdominal distention please return to urgent care to be reevaluated. ?Please start with oral fluid intake and advance diet as tolerated ? ? ?ED Prescriptions   ? ? Medication Sig Dispense Auth. Provider  ? pantoprazole (PROTONIX) 20 MG tablet Take 1 tablet (20 mg total) by mouth daily. 30 tablet Karilynn Carranza, Britta Mccreedy, MD  ? ondansetron (ZOFRAN-ODT) 4 MG disintegrating tablet Take 1 tablet (4 mg total) by mouth every 8 (eight) hours as needed for nausea or vomiting. 20 tablet Shavy Beachem, Britta Mccreedy, MD  ? ?  ? ?PDMP not reviewed this encounter. ?  ?Merrilee Jansky, MD ?11/15/21 2357 ? ?

## 2021-11-15 NOTE — Discharge Instructions (Addendum)
Please take medications as prescribed ?Please take over-the-counter probiotics. ?Increase oral fluid intake ?If you have worsening abdominal pain, bloody vomitus or bowel movements or abdominal distention please return to urgent care to be reevaluated. ?Please start with oral fluid intake and advance diet as tolerated ?

## 2021-11-15 NOTE — ED Triage Notes (Signed)
Pt is present today with general abdominal pain, vomiting, and diarrhea. Pt states that her sx started Tuesday  ?

## 2021-12-04 ENCOUNTER — Ambulatory Visit: Payer: Commercial Managed Care - PPO | Admitting: Family

## 2022-01-28 ENCOUNTER — Ambulatory Visit (HOSPITAL_COMMUNITY)
Admission: EM | Admit: 2022-01-28 | Discharge: 2022-01-28 | Disposition: A | Discharge status: Home / Self Care | Payer: Commercial Managed Care - PPO | Attending: Emergency Medicine | Admitting: Emergency Medicine

## 2022-01-28 ENCOUNTER — Encounter (HOSPITAL_COMMUNITY): Payer: Self-pay | Admitting: Emergency Medicine

## 2022-01-28 ENCOUNTER — Other Ambulatory Visit: Payer: Self-pay

## 2022-01-28 DIAGNOSIS — H109 Unspecified conjunctivitis: Secondary | ICD-10-CM | POA: Diagnosis not present

## 2022-01-28 MED ORDER — POLYMYXIN B-TRIMETHOPRIM 10000-0.1 UNIT/ML-% OP SOLN: 1.0000 [drp] | OPHTHALMIC | 0 refills | Status: DC

## 2022-01-28 NOTE — Discharge Instructions (Addendum)
Today you being treated for bacterial conjunctivitis.   Place one drop of polytrim into the effected eye every 4 hours while awake for 7 days. If the other eye starts to have symptoms you may use medication in it as well. Do not allow tip of dropper to touch eye.  May use cool compress for comfort and to remove discharge if present. Pat the eye, do not wipe.  Do not rub eyes, this may cause more irritation.  May use Claritin, Zyrtec, or benadryl as needed to help if itching present.  Please avoid use of eye makeup until symptoms clear.  If symptoms persist after use of medication, please follow up at Urgent Care or with ophthalmologist (eye doctor)

## 2022-01-28 NOTE — ED Triage Notes (Signed)
6/16 left eye was watery, tearing.  6/17 woke with left eye swollen and closed.  Now eye is red, yellow discharge, swelling.  Patient does not wear contacts.  Denies using any eye drops.  Patient reports eyebrow was very itchy prior to this starting

## 2022-01-28 NOTE — ED Provider Notes (Signed)
[facility]    CSN: [id number] Arrival date & time: 01/28/22  0859      History   Chief Complaint Chief Complaint  Patient presents with   Eye Problem    HPI Mallory Mcintyre is a 21 y.o. female.   Patient presents with left eye watering, swelling, redness light sensitivity and yellow discharge for 3 days.  No known sick contacts.  Has not attempted treatment.  Denies blurred vision, fever, chills or URI symptoms.  Past Medical History:  Diagnosis Date   Encounter for supervision of normal pregnancy, antepartum 10/20/2018    Nursing Staff Provider Office Location  [facility] Dating   LMP Language   English  Anatomy US   normal female  Flu Vaccine   Declined 10/20/2018 Genetic Screen  NIPS: low risk  AFP:  Negative   TDaP vaccine  Declined 04-26-19 Hgb A1C or  GTT   Third trimester 1hr GTT 79 Rhogam     LAB RESULTS  Feeding Plan  Breast Blood Type B/Positive/-- (03/10 1535)  Contraception  Undecided 04-26-19 Antibody Negati   Scoliosis     Patient Active Problem List   Diagnosis Date Noted   History of spinal fusion for scoliosis 09/05/2021   Multiple joint pain 09/05/2021   Alpha thalassemia silent carrier 11/03/2018   Marijuana use 10/21/2018   Scoliosis 11/26/2013    Past Surgical History:  Procedure Laterality Date   SPINAL FUSION  08/12/2013    OB History     Gravida  1   Para  1   Term  1   Preterm  0   AB  0   Living  1      SAB  0   IAB  0   Ectopic  0   Multiple  0   Live Births  1        Obstetric Comments  LMP: 03/14/2021          Home Medications    Prior to Admission medications   Medication Sig Start Date End Date Taking? Authorizing Provider  ondansetron (ZOFRAN-ODT) 4 MG disintegrating tablet Take 1 tablet (4 mg total) by mouth every 8 (eight) hours as needed for nausea or vomiting. Patient not taking: Reported on 01/28/2022 11/15/21   Merrilee Jansky, MD  pantoprazole (PROTONIX) 20 MG tablet Take 1 tablet (20 mg total)  by mouth daily. Patient not taking: Reported on 01/28/2022 11/15/21   Merrilee Jansky, MD    Family History Family History  Problem Relation Age of Onset   Heart disease Maternal Grandmother    Drug abuse Maternal Grandfather    COPD Maternal Grandfather    Alcohol abuse Maternal Grandfather    Diabetes Other    Asthma Other    Hypertension Other     Social History Social History   Tobacco Use   Smoking status: Some Days    Types: Cigars   Smokeless tobacco: Never   Tobacco comments:    2-3 Black n mild daily   Vaping Use   Vaping Use: Never used  Substance Use Topics   Alcohol use: Yes   Drug use: Not Currently    Types: Marijuana    Comment: last smoked June 2020     Allergies   Patient has no known allergies.   Review of Systems Review of Systems  Constitutional: Negative.   HENT: Negative.    Eyes:  Positive for photophobia, pain, discharge and itching. Negative for redness and visual disturbance.  Respiratory: Negative.    Cardiovascular: Negative.   Skin: Negative.   Neurological: Negative.      Physical Exam Triage Vital Signs ED Triage Vitals  Enc Vitals Group     BP 01/28/22 1001 127/84     Pulse Rate 01/28/22 1001 99     Resp 01/28/22 1001 18     Temp 01/28/22 1001 98.4 F (36.9 C)     Temp Source 01/28/22 1001 Oral     SpO2 01/28/22 1001 98 %     Weight --      Height --      Head Circumference --      Peak Flow --      Pain Score 01/28/22 0957 6     Pain Loc --      Pain Edu? --      Excl. in GC? --    No data found.  Updated Vital Signs BP 127/84 (BP Location: Right Arm)   Pulse 99   Temp 98.4 F (36.9 C) (Oral)   Resp 18   LMP 12/10/2021   SpO2 98%   Visual Acuity Right Eye Distance:   Left Eye Distance:   Bilateral Distance:    Right Eye Near:   Left Eye Near:    Bilateral Near:     Physical Exam Constitutional:      Appearance: Normal appearance.  HENT:     Head: Normocephalic.  Eyes:     Comments:  Erythema noted to the left conjunctiva with mild to moderate periorbital swelling, no drainage noted on exam, vision grossly intact, extraocular movements intact  Pulmonary:     Effort: Pulmonary effort is normal.  Skin:    General: Skin is warm and dry.  Neurological:     Mental Status: She is alert and oriented to person, place, and time. Mental status is at baseline.  Psychiatric:        Mood and Affect: Mood normal.        Behavior: Behavior normal.      UC Treatments / Results  Labs (all labs ordered are listed, but only abnormal results are displayed) Labs Reviewed - No data to display  EKG   Radiology No results found.  Procedures Procedures (including critical care time)  Medications Ordered in UC Medications - No data to display  Initial Impression / Assessment and Plan / UC Course  I have reviewed the triage vital signs and the nursing notes.  Pertinent labs & imaging results that were available during my care of the patient were reviewed by me and considered in my medical decision making (see chart for details).  Bacterial conjunctivitis of left eye  Presentation is consistent with conjunctivitis, discussed with patient, Polytrim 7-day course prescribed, discussed administration, advised against any aggravating or touching to prevent further contamination and spread, may use cool compresses and napkins to remove secretions, if pruritus occurs may use oral antihistamines, may use Tylenol ibuprofen for general comfort, may follow-up with this urgent care or ophthalmologist if symptoms continue to persist Final Clinical Impressions(s) / UC Diagnoses   Final diagnoses:  None   Discharge Instructions   None    ED Prescriptions   None    PDMP not reviewed this encounter.   Valinda Hoar, NP 01/28/22 1037

## 2022-02-04 ENCOUNTER — Encounter (HOSPITAL_COMMUNITY): Payer: Self-pay | Admitting: Emergency Medicine

## 2022-02-04 ENCOUNTER — Ambulatory Visit (HOSPITAL_COMMUNITY)
Admission: EM | Admit: 2022-02-04 | Discharge: 2022-02-04 | Disposition: A | Discharge status: Home / Self Care | Payer: Commercial Managed Care - PPO | Attending: Emergency Medicine | Admitting: Emergency Medicine

## 2022-02-04 DIAGNOSIS — Z3201 Encounter for pregnancy test, result positive: Secondary | ICD-10-CM | POA: Diagnosis present

## 2022-02-04 DIAGNOSIS — H1033 Unspecified acute conjunctivitis, bilateral: Secondary | ICD-10-CM | POA: Insufficient documentation

## 2022-02-04 LAB — POCT URINALYSIS DIPSTICK, ED / UC
Bilirubin Urine: NEGATIVE
Glucose, UA: NEGATIVE mg/dL
Ketones, ur: NEGATIVE mg/dL
Leukocytes,Ua: NEGATIVE
Nitrite: NEGATIVE
Protein, ur: NEGATIVE mg/dL
Specific Gravity, Urine: >=1.03 (ref 1.005–1.030)
Urobilinogen, UA: 0.2 mg/dL (ref 0.0–1.0)
pH: 5.5 (ref 5.0–8.0)

## 2022-02-04 LAB — POC URINE PREG, ED: Preg Test, Ur: POSITIVE — AB

## 2022-02-04 MED ORDER — ERYTHROMYCIN 5 MG/GM OP OINT: TOPICAL_OINTMENT | OPHTHALMIC | 0 refills | Status: DC

## 2022-02-05 ENCOUNTER — Encounter (HOSPITAL_COMMUNITY): Payer: Self-pay | Admitting: Obstetrics and Gynecology

## 2022-02-05 ENCOUNTER — Ambulatory Visit (INDEPENDENT_AMBULATORY_CARE_PROVIDER_SITE_OTHER): Payer: Commercial Managed Care - PPO | Admitting: Family

## 2022-02-05 ENCOUNTER — Inpatient Hospital Stay (HOSPITAL_COMMUNITY)
Admission: AD | Admit: 2022-02-05 | Discharge: 2022-02-06 | Disposition: A | Discharge status: Home / Self Care | Payer: Commercial Managed Care - PPO | Attending: Obstetrics and Gynecology | Admitting: Obstetrics and Gynecology

## 2022-02-05 ENCOUNTER — Encounter: Payer: Self-pay | Admitting: Family

## 2022-02-05 ENCOUNTER — Inpatient Hospital Stay (HOSPITAL_COMMUNITY): Payer: Commercial Managed Care - PPO

## 2022-02-05 DIAGNOSIS — O26891 Other specified pregnancy related conditions, first trimester: Secondary | ICD-10-CM | POA: Insufficient documentation

## 2022-02-05 DIAGNOSIS — Z3A08 8 weeks gestation of pregnancy: Secondary | ICD-10-CM | POA: Diagnosis not present

## 2022-02-05 DIAGNOSIS — O209 Hemorrhage in early pregnancy, unspecified: Secondary | ICD-10-CM | POA: Diagnosis present

## 2022-02-05 DIAGNOSIS — Z91199 Patient's noncompliance with other medical treatment and regimen due to unspecified reason: Secondary | ICD-10-CM

## 2022-02-05 DIAGNOSIS — O3680X0 Pregnancy with inconclusive fetal viability, not applicable or unspecified: Secondary | ICD-10-CM | POA: Diagnosis not present

## 2022-02-05 DIAGNOSIS — O26899 Other specified pregnancy related conditions, unspecified trimester: Secondary | ICD-10-CM

## 2022-02-05 LAB — COMPREHENSIVE METABOLIC PANEL
ALT: 32 U/L (ref 0–44)
AST: 27 U/L (ref 15–41)
Albumin: 3.6 g/dL (ref 3.5–5.0)
Alkaline Phosphatase: 68 U/L (ref 38–126)
Anion gap: 6 (ref 5–15)
BUN: 7 mg/dL (ref 6–20)
CO2: 26 mmol/L (ref 22–32)
Calcium: 9 mg/dL (ref 8.9–10.3)
Chloride: 105 mmol/L (ref 98–111)
Creatinine, Ser: 0.75 mg/dL (ref 0.44–1.00)
GFR, Estimated: >60 mL/min (ref >60)
Glucose, Bld: 83 mg/dL (ref 70–99)
Potassium: 4.2 mmol/L (ref 3.5–5.1)
Sodium: 137 mmol/L (ref 135–145)
Total Bilirubin: 0.4 mg/dL (ref 0.3–1.2)
Total Protein: 6.5 g/dL (ref 6.5–8.1)

## 2022-02-05 LAB — CBC
HCT: 42.7 % (ref 36.0–46.0)
Hemoglobin: 13.7 g/dL (ref 12.0–15.0)
MCH: 31.1 pg (ref 26.0–34.0)
MCHC: 32.1 g/dL (ref 30.0–36.0)
MCV: 96.8 fL (ref 80.0–100.0)
Platelets: 251 10*3/uL (ref 150–400)
RBC: 4.41 MIL/uL (ref 3.87–5.11)
RDW: 12.6 % (ref 11.5–15.5)
WBC: 11 10*3/uL — ABNORMAL HIGH (ref 4.0–10.5)
nRBC: 0 % (ref 0.0–0.2)

## 2022-02-05 LAB — URINALYSIS, ROUTINE W REFLEX MICROSCOPIC
Bacteria, UA: NONE SEEN
Bilirubin Urine: NEGATIVE
Glucose, UA: NEGATIVE mg/dL
Ketones, ur: NEGATIVE mg/dL
Nitrite: NEGATIVE
Protein, ur: NEGATIVE mg/dL
Specific Gravity, Urine: 1.027 (ref 1.005–1.030)
pH: 6 (ref 5.0–8.0)

## 2022-02-05 LAB — WET PREP, GENITAL
Clue Cells Wet Prep HPF POC: NONE SEEN
Sperm: NONE SEEN
Trich, Wet Prep: NONE SEEN
WBC, Wet Prep HPF POC: <10 (ref <10)
Yeast Wet Prep HPF POC: NONE SEEN

## 2022-02-05 LAB — HCG, QUANTITATIVE, PREGNANCY: hCG, Beta Chain, Quant, S: 7956 m[IU]/mL — ABNORMAL HIGH (ref <5)

## 2022-02-05 NOTE — Progress Notes (Signed)
Wynelle Bourgeois CNM in earlier to discuss test results and d/c plan. Will have repeat labs drawn Fri am at [facility]. Pt voices understanding

## 2022-02-05 NOTE — [facility] Provider Note (Signed)
Chief Complaint: Vaginal Bleeding   Event Date/Time   First Provider Initiated Contact with Patient 02/05/22 2207        SUBJECTIVE HPI: Mallory Mcintyre is a 21 y.o. G2P1001 at [redacted]w[redacted]d by LMP who presents to maternity admissions reporting vaginal spotting yesterday.  Had more bleeding today and passed a clot.  Has mild low back cramping. She denies vaginal itching/burning, urinary symptoms, h/a, dizziness, n/v, or fever/chills.    Vaginal Bleeding The patient's primary symptoms include vaginal bleeding. The patient's pertinent negatives include no genital itching, genital lesions, genital odor or pelvic pain. This is a new problem. The current episode started yesterday. The pain is mild. She is pregnant. Associated symptoms include back pain. Pertinent negatives include no abdominal pain, chills, constipation, diarrhea, dysuria, fever or frequency. The vaginal discharge was bloody. The vaginal bleeding is spotting. She has been passing clots. She has not been passing tissue. Nothing aggravates the symptoms. She has tried nothing for the symptoms.   RN Note .Mallory Mcintyre is a 21 y.o. at Unknown here in MAU reporting: she found out she was preg one week ago. Seen in urgent care yesterday and told them she was having some spotting. About one hour ago she passed a clot. Some lower back cramping.  LMP: 12/18/2021 Onset of complaint: 2030 Pain score: 3/10  Past Medical History:  Diagnosis Date   Encounter for supervision of normal pregnancy, antepartum 10/20/2018    Nursing Staff Provider Office Location  [facility] Dating   LMP Language   English  Anatomy US   normal female  Flu Vaccine   Declined 10/20/2018 Genetic Screen  NIPS: low risk  AFP:  Negative   TDaP vaccine  Declined 04-26-19 Hgb A1C or  GTT   Third trimester 1hr GTT 79 Rhogam     LAB RESULTS  Feeding Plan  Breast Blood Type B/Positive/-- (03/10 1535)  Contraception  Undecided 04-26-19 Antibody Negati   Scoliosis    Past Surgical History:   Procedure Laterality Date   SPINAL FUSION  08/12/2013   Social History   Socioeconomic History   Marital status: Single    Spouse name: Not on file   Number of children: Not on file   Years of education: Not on file   Highest education level: Not on file  Occupational History   Not on file  Tobacco Use   Smoking status: Some Days    Types: Cigars   Smokeless tobacco: Never   Tobacco comments:    2-3 Black n mild daily   Vaping Use   Vaping Use: Never used  Substance and Sexual Activity   Alcohol use: Yes   Drug use: Not Currently    Types: Marijuana    Comment: last smoked June 2020   Sexual activity: Yes    Partners: Male    Birth control/protection: None  Other Topics Concern   Not on file  Social History Narrative   Not on file   Social Determinants of Health   Financial Resource Strain: Not on file  Food Insecurity: Not on file  Transportation Needs: Not on file  Physical Activity: Not on file  Stress: Not on file  Social Connections: Not on file  Intimate Partner Violence: Not on file   No current facility-administered medications on file prior to encounter.   Current Outpatient Medications on File Prior to Encounter  Medication Sig Dispense Refill   erythromycin ophthalmic ointment Place a 1/2 inch ribbon of ointment into the lower eyelid. 3.5 g  0   ondansetron (ZOFRAN-ODT) 4 MG disintegrating tablet Take 1 tablet (4 mg total) by mouth every 8 (eight) hours as needed for nausea or vomiting. 20 tablet 0   pantoprazole (PROTONIX) 20 MG tablet Take 1 tablet (20 mg total) by mouth daily. 30 tablet 0   No Known Allergies  I have reviewed patient's Past Medical Hx, Surgical Hx, Family Hx, Social Hx, medications and allergies.   ROS:  Review of Systems  Constitutional:  Negative for chills and fever.  Gastrointestinal:  Negative for abdominal pain, constipation and diarrhea.  Genitourinary:  Positive for vaginal bleeding. Negative for dysuria, frequency  and pelvic pain.  Musculoskeletal:  Positive for back pain.   Review of Systems  Other systems negative   Physical Exam  Physical Exam Patient Vitals for the past 24 hrs:  BP Temp Pulse Resp SpO2 Height Weight  02/05/22 2148 107/66 99.1 F (37.3 C) 83 15 99 % 5\' 5"  (1.651 m) 78.9 kg   Constitutional: Well-developed, well-nourished female in no acute distress.  Cardiovascular: normal rate Respiratory: normal effort GI: Abd soft, non-tender.  MS: Extremities nontender, no edema, normal ROM Neurologic: Alert and oriented x 4.  GU: Neg CVAT.  PELVIC EXAM: Deferred in lieu of transvaginal ultrasound, lower abdomen nontender to palpation.  LAB RESULTS Results for orders placed or performed during the hospital encounter of 02/05/22 (from the past 24 hour(s))  Urinalysis, Routine w reflex microscopic Urine, Clean Catch     Status: Abnormal   Collection Time: 02/05/22  9:51 PM  Result Value Ref Range   Color, Urine YELLOW YELLOW   APPearance CLEAR CLEAR   Specific Gravity, Urine 1.027 1.005 - 1.030   pH 6.0 5.0 - 8.0   Glucose, UA NEGATIVE NEGATIVE mg/dL   Hgb urine dipstick MODERATE (A) NEGATIVE   Bilirubin Urine NEGATIVE NEGATIVE   Ketones, ur NEGATIVE NEGATIVE mg/dL   Protein, ur NEGATIVE NEGATIVE mg/dL   Nitrite NEGATIVE NEGATIVE   Leukocytes,Ua TRACE (A) NEGATIVE   RBC / HPF 0-5 0 - 5 RBC/hpf   WBC, UA 0-5 0 - 5 WBC/hpf   Bacteria, UA NONE SEEN NONE SEEN   Squamous Epithelial / LPF 0-5 0 - 5   Mucus PRESENT   Wet prep, genital     Status: None   Collection Time: 02/05/22 10:10 PM   Specimen: PATH Cytology Cervicovaginal Ancillary Only  Result Value Ref Range   Yeast Wet Prep HPF POC NONE SEEN NONE SEEN   Trich, Wet Prep NONE SEEN NONE SEEN   Clue Cells Wet Prep HPF POC NONE SEEN NONE SEEN   WBC, Wet Prep HPF POC <10 <10   Sperm NONE SEEN   hCG, quantitative, pregnancy     Status: Abnormal   Collection Time: 02/05/22 10:28 PM  Result Value Ref Range   hCG, Beta  Chain, Quant, S 7,956 (H) <5 mIU/mL  CBC     Status: Abnormal   Collection Time: 02/05/22 10:28 PM  Result Value Ref Range   WBC 11.0 (H) 4.0 - 10.5 K/uL   RBC 4.41 3.87 - 5.11 MIL/uL   Hemoglobin 13.7 12.0 - 15.0 g/dL   HCT 02/07/22 67.3 - 41.9 %   MCV 96.8 80.0 - 100.0 fL   MCH 31.1 26.0 - 34.0 pg   MCHC 32.1 30.0 - 36.0 g/dL   RDW 37.9 02.4 - 09.7 %   Platelets 251 150 - 400 K/uL   nRBC 0.0 0.0 - 0.2 %  Comprehensive metabolic panel  Status: None   Collection Time: 02/05/22 10:28 PM  Result Value Ref Range   Sodium 137 135 - 145 mmol/L   Potassium 4.2 3.5 - 5.1 mmol/L   Chloride 105 98 - 111 mmol/L   CO2 26 22 - 32 mmol/L   Glucose, Bld 83 70 - 99 mg/dL   BUN 7 6 - 20 mg/dL   Creatinine, Ser 0.75 0.44 - 1.00 mg/dL   Calcium 9.0 8.9 - 10.3 mg/dL   Total Protein 6.5 6.5 - 8.1 g/dL   Albumin 3.6 3.5 - 5.0 g/dL   AST 27 15 - 41 U/L   ALT 32 0 - 44 U/L   Alkaline Phosphatase 68 38 - 126 U/L   Total Bilirubin 0.4 0.3 - 1.2 mg/dL   GFR, Estimated >60 >60 mL/min   Anion gap 6 5 - 15     IMAGING US OB LESS THAN 14 WEEKS WITH OB TRANSVAGINAL  Result Date: 02/05/2022 CLINICAL DATA:  [id number]. Vaginal spotting. Back pain. Passed clot yesterday. Gestational age by last menstrual period 7 weeks and 0 days. Estimated due date by last menstrual period 09/24/2022. Last menstrual period 12/18/2021 EXAM: OBSTETRIC <14 WK [place] AND TRANSVAGINAL OB US TECHNIQUE: Both transabdominal and transvaginal ultrasound examinations were performed for complete evaluation of the gestation as well as the maternal uterus, adnexal regions, and pelvic cul-de-sac. Transvaginal technique was performed to assess early pregnancy. COMPARISON:  None Available. FINDINGS: Intrauterine gestational sac: Single Yolk sac:  Not Visualized. Embryo:  Not Visualized. Cardiac Activity: Not Visualized. MSD: 7.6 mm   5 w   4 d Subchorionic hemorrhage:  None visualized. Maternal uterus/adnexae: Bilateral ovaries are unremarkable.  Other: No free fluid within the pelvis. IMPRESSION: Probable early intrauterine gestational sac, but no yolk sac, fetal pole, or cardiac activity yet visualized. Recommend follow-up quantitative B-HCG levels and follow-up US in 14 days to assess viability. This recommendation follows SRU consensus guidelines: Diagnostic Criteria for Nonviable Pregnancy Early in the First Trimester. [employer] Med [id number]. Electronically Signed   By: Iven Finn M.D.   On: 02/05/2022 23:13     MAU Management/MDM: I have reviewed the triage vital signs and the nursing notes.   Pertinent labs & imaging results that were available during my care of the patient were reviewed by me and considered in my medical decision making (see chart for details).      I have reviewed her medical records including past results, notes and treatments.   Ordered usual first trimester r/o ectopic labs.   Pelvic cultures done Will check baseline Ultrasound to rule out ectopic.  This bleeding/pain can represent a normal pregnancy with bleeding, spontaneous abortion or even an ectopic which can be life-threatening.  The process as listed above helps to determine which of these is present.  Reviewed findings of Gestational sac. Discussed since we do not see a yolk sac yet, we cannot exclude ectopic pregnancy Recommend repeat HCG in 48 hours Message sent to clinic to schedule Friday morning (I was unable to get scheduled)   ASSESSMENT Pregnancy at [redacted]w[redacted]d by LMP Bleeding in the first trimester Pregnancy of unknown location  PLAN Discharge home Plan to repeat HCG level in 48 hours in clinic Friday AM Will repeat  Ultrasound in about 7-10 days if HCG levels double appropriately  Ectopic precautions  Pt stable at time of discharge. Encouraged to return here if she develops worsening of symptoms, increase in pain, fever, or other concerning symptoms.  Wynelle Bourgeois CNM, MSN Certified Nurse-Midwife 02/05/2022  10:08  PM

## 2022-02-06 DIAGNOSIS — O26899 Other specified pregnancy related conditions, unspecified trimester: Secondary | ICD-10-CM | POA: Diagnosis not present

## 2022-02-06 DIAGNOSIS — R109 Unspecified abdominal pain: Secondary | ICD-10-CM

## 2022-02-06 DIAGNOSIS — Z3A08 8 weeks gestation of pregnancy: Secondary | ICD-10-CM | POA: Diagnosis not present

## 2022-02-06 DIAGNOSIS — O3680X0 Pregnancy with inconclusive fetal viability, not applicable or unspecified: Secondary | ICD-10-CM

## 2022-02-06 DIAGNOSIS — O209 Hemorrhage in early pregnancy, unspecified: Secondary | ICD-10-CM | POA: Diagnosis not present

## 2022-02-06 LAB — URINE CULTURE: Culture: 60000 — AB

## 2022-02-06 LAB — GC/CHLAMYDIA PROBE AMP (~~LOC~~) NOT AT ARMC
Chlamydia: NEGATIVE
Comment: NEGATIVE
Comment: NORMAL
Neisseria Gonorrhea: NEGATIVE

## 2022-02-08 ENCOUNTER — Telehealth: Payer: Self-pay | Admitting: Family Medicine

## 2022-02-08 NOTE — Telephone Encounter (Signed)
Patient called back said she can't come to our office today due to work, she said she will return back to MAU

## 2022-02-08 NOTE — Telephone Encounter (Signed)
Called and left patient a detailed message to call the office back in regards to making a appointment for HCG level check

## 2022-02-10 ENCOUNTER — Inpatient Hospital Stay (HOSPITAL_COMMUNITY)
Admission: AD | Admit: 2022-02-10 | Discharge: 2022-02-10 | Disposition: A | Discharge status: Home / Self Care | Payer: Commercial Managed Care - PPO | Attending: Family Medicine | Admitting: Family Medicine

## 2022-02-10 ENCOUNTER — Inpatient Hospital Stay (HOSPITAL_COMMUNITY): Payer: Commercial Managed Care - PPO

## 2022-02-10 DIAGNOSIS — R109 Unspecified abdominal pain: Secondary | ICD-10-CM | POA: Diagnosis not present

## 2022-02-10 DIAGNOSIS — O208 Other hemorrhage in early pregnancy: Secondary | ICD-10-CM | POA: Diagnosis not present

## 2022-02-10 DIAGNOSIS — O26891 Other specified pregnancy related conditions, first trimester: Secondary | ICD-10-CM | POA: Insufficient documentation

## 2022-02-10 DIAGNOSIS — Z3490 Encounter for supervision of normal pregnancy, unspecified, unspecified trimester: Secondary | ICD-10-CM | POA: Diagnosis not present

## 2022-02-10 DIAGNOSIS — O368310 Maternal care for abnormalities of the fetal heart rate or rhythm, first trimester, not applicable or unspecified: Secondary | ICD-10-CM | POA: Diagnosis not present

## 2022-02-10 DIAGNOSIS — O418X10 Other specified disorders of amniotic fluid and membranes, first trimester, not applicable or unspecified: Secondary | ICD-10-CM | POA: Diagnosis not present

## 2022-02-10 DIAGNOSIS — Z3A01 Less than 8 weeks gestation of pregnancy: Secondary | ICD-10-CM | POA: Diagnosis not present

## 2022-02-10 DIAGNOSIS — O368390 Maternal care for abnormalities of the fetal heart rate or rhythm, unspecified trimester, not applicable or unspecified: Secondary | ICD-10-CM

## 2022-02-10 LAB — HEMOGLOBIN AND HEMATOCRIT, BLOOD
HCT: 41.9 % (ref 36.0–46.0)
Hemoglobin: 13.5 g/dL (ref 12.0–15.0)

## 2022-02-10 LAB — HCG, QUANTITATIVE, PREGNANCY: hCG, Beta Chain, Quant, S: 19541 m[IU]/mL — ABNORMAL HIGH (ref <5)

## 2022-02-10 MED ORDER — PREPLUS 27-1 MG PO TABS: 1.0000 | ORAL_TABLET | Freq: Every day | ORAL | 13 refills | Status: DC

## 2022-02-10 NOTE — MAU Note (Addendum)
Mallory Mcintyre is a 21 y.o. at [redacted]w[redacted]d here in MAU reporting: been having heavier bleeding.  Was just spotting.  Is still like a spotting, just heavier. Mild cramping. No recent intercourse. [facility], unable because of work.  Onset of complaint: this morning Pain score: mild Vitals:   02/10/22 0956  BP: 120/63  Pulse: (!) 108  Resp: 16  Temp: 98.5 F (36.9 C)  SpO2: 100%      Lab orders placed from triage:

## 2022-02-10 NOTE — MAU Provider Note (Signed)
History     CSN: [id number]  Arrival date and time: 02/10/22 0946   Event Date/Time   First Provider Initiated Contact with Patient 02/10/22 [phone]      Chief Complaint  Patient presents with   Vaginal Bleeding   Abdominal Pain   Follow-up   HPI Mallory Mcintyre is a 21 y.o. G2P1001 with pregnancy of unknown location who presents to MAU with chief complaints of vaginal bleeding and abdominal pain. She presented to MAU on 02/06/2022 with similar complaints and was unable to attend her outpatient appointment for followup. Bleeding was initially dark brown, then pink-tinged. Yesterday it became light red and she consistently sees smears of blood when she wipes after voiding. She is wearing a pad but is not saturating it. She is not experiencing any associated dizziness or weakness.  Patient's abdominal pain is across her lower abdomen. Pain is "mild". She denies aggravating or alleviating factors. She has not attempted medication management.  Patient has scheduled prenatal care with [facility].  OB History     Gravida  2   Para  1   Term  1   Preterm  0   AB  0   Living  1      SAB  0   IAB  0   Ectopic  0   Multiple  0   Live Births  1        Obstetric Comments  LMP: 03/14/2021         Past Medical History:  Diagnosis Date   Encounter for supervision of normal pregnancy, antepartum 10/20/2018    Nursing Staff Provider Office Location  [facility] Dating   LMP Language   English  Anatomy US   normal female  Flu Vaccine   Declined 10/20/2018 Genetic Screen  NIPS: low risk  AFP:  Negative   TDaP vaccine  Declined 04-26-19 Hgb A1C or  GTT   Third trimester 1hr GTT 79 Rhogam     LAB RESULTS  Feeding Plan  Breast Blood Type B/Positive/-- (03/10 1535)  Contraception  Undecided 04-26-19 Antibody Negati   Scoliosis     Past Surgical History:  Procedure Laterality Date   SPINAL FUSION  08/12/2013    Family History  Problem Relation Age of Onset   Heart disease Maternal  Grandmother    Drug abuse Maternal Grandfather    COPD Maternal Grandfather    Alcohol abuse Maternal Grandfather    Diabetes Other    Asthma Other    Hypertension Other     Social History   Tobacco Use   Smoking status: Some Days    Types: Cigars   Smokeless tobacco: Never   Tobacco comments:    2-3 Black n mild daily   Vaping Use   Vaping Use: Never used  Substance Use Topics   Alcohol use: Yes   Drug use: Not Currently    Types: Marijuana    Comment: last smoked June 2020    Allergies: No Known Allergies  Medications Prior to Admission  Medication Sig Dispense Refill Last Dose   erythromycin ophthalmic ointment Place a 1/2 inch ribbon of ointment into the lower eyelid. 3.5 g 0    ondansetron (ZOFRAN-ODT) 4 MG disintegrating tablet Take 1 tablet (4 mg total) by mouth every 8 (eight) hours as needed for nausea or vomiting. 20 tablet 0    pantoprazole (PROTONIX) 20 MG tablet Take 1 tablet (20 mg total) by mouth daily. 30 tablet 0     Review of  Systems  Gastrointestinal:  Positive for abdominal pain.  Genitourinary:  Positive for vaginal bleeding.  All other systems reviewed and are negative.  Physical Exam   Blood pressure 120/63, pulse (!) 108, temperature 98.5 F (36.9 C), temperature source Oral, resp. rate 16, height 5\' 5"  (1.651 m), weight 78.8 kg, last menstrual period 12/10/2021, SpO2 100 %.  Physical Exam Vitals and nursing note reviewed. Exam conducted with a chaperone present.  Constitutional:      Appearance: She is well-developed.  Cardiovascular:     Heart sounds: Normal heart sounds.  Pulmonary:     Effort: Pulmonary effort is normal.     Breath sounds: Normal breath sounds.  Abdominal:     General: Abdomen is flat.     Palpations: Abdomen is soft.     Tenderness: There is no abdominal tenderness. There is no right CVA tenderness or left CVA tenderness.  Skin:    Capillary Refill: Capillary refill takes less than 2 seconds.  Neurological:      Mental Status: She is alert and oriented to person, place, and time.  Psychiatric:        Mood and Affect: Mood normal.        Behavior: Behavior normal.     MAU Course  Procedures  MDM  --Large subchorionic hematoma and fetal bradycardia noted on today's 02/09/2022. Discussed increased risk of miscarriage and reviewed bleeding precautions, when to return to MAU  Orders Placed This Encounter  Procedures   hCG, quantitative, pregnancy   Hemoglobin and hematocrit, blood   Patient Vitals for the past 24 hrs:  BP Temp Temp src Pulse Resp SpO2 Height Weight  02/10/22 0956 120/63 98.5 F (36.9 C) Oral (!) 108 16 100 % 5\' 5"  (1.651 m) 173 lb 12.8 oz (78.8 kg)   Results for orders placed or performed during the hospital encounter of 02/10/22 (from the past 24 hour(s))  hCG, quantitative, pregnancy     Status: Abnormal   Collection Time: 02/10/22 10:03 AM  Result Value Ref Range   hCG, Beta Chain, Quant, S 19,541 (H) <5 mIU/mL  Hemoglobin and hematocrit, blood     Status: None   Collection Time: 02/10/22 10:03 AM  Result Value Ref Range   Hemoglobin 13.5 12.0 - 15.0 g/dL   HCT 04/13/22 04/13/22 - 95.2 %   84.1 OB Transvaginal  Result Date: 02/10/2022 CLINICAL DATA:  Abdominal pain, cramping, and bleeding in 1st trimester pregnancy. EXAM: TRANSVAGINAL OB ULTRASOUND TECHNIQUE: Transvaginal ultrasound was performed for complete evaluation of the gestation as well as the maternal uterus, adnexal regions, and pelvic cul-de-sac. COMPARISON:  02/05/2022 FINDINGS: Intrauterine gestational sac: Single Yolk sac:  Visualized. Embryo:  Visualized. Cardiac Activity: Visualized. Heart Rate: 79 bpm CRL:   4 mm   6 w 0 d                  04/13/2022 EDC: 10/06/2022 Subchorionic hemorrhage:  A large subchorionic hemorrhage is seen. Maternal uterus/adnexae: No fibroids identified. Both ovaries are normal in appearance. No adnexal mass or abnormal free fluid identified. IMPRESSION: Single living IUP with estimated gestational age of [redacted]  weeks 0 days, and [place] EDC of 10/06/2022. Embryonic bradycardia and large subchorionic hemorrhage are poor prognostic signs. Electronically Signed   By: [place] M.D.   On: 02/10/2022 12:14    Meds ordered this encounter  Medications   Prenatal Vit-Fe Fumarate-FA (PREPLUS) 27-1 MG TABS    Sig: Take 1 tablet by mouth daily.  Dispense:  30 tablet    Refill:  13    Order Specific Question:   Supervising Provider    Answer:   Reva Bores [[id number]]    Assessment and Plan  --21 y.o. G2P1001 with live IUP at [redacted]w[redacted]d  --Fetal bradycardia (79 bpm) --Subchorionic hematoma, pelvic rest advised --Blood type B POS --Discharge home in stable condition  F/U: --Order placed for repeat viability scan in 10 days at [facility] due to fetal bradycardia  Calvert Cantor, CNM 02/10/2022, 2:41 PM

## 2022-02-10 NOTE — Discharge Instructions (Signed)

## 2022-02-13 ENCOUNTER — Telehealth: Payer: Commercial Managed Care - PPO

## 2022-02-18 NOTE — Progress Notes (Signed)
pt was a no show for appointment

## 2022-03-20 ENCOUNTER — Other Ambulatory Visit (HOSPITAL_COMMUNITY)
Admission: RE | Admit: 2022-03-20 | Discharge: 2022-03-20 | Disposition: A | Discharge status: Home / Self Care | Payer: Commercial Managed Care - PPO | Source: Ambulatory Visit | Attending: Obstetrics and Gynecology | Admitting: Obstetrics and Gynecology

## 2022-03-20 ENCOUNTER — Ambulatory Visit (INDEPENDENT_AMBULATORY_CARE_PROVIDER_SITE_OTHER): Payer: Commercial Managed Care - PPO | Admitting: Obstetrics and Gynecology

## 2022-03-20 ENCOUNTER — Other Ambulatory Visit: Payer: Self-pay

## 2022-03-20 VITALS — BP 115/76 | HR 96 | Wt 172.0 lb

## 2022-03-20 DIAGNOSIS — Z3480 Encounter for supervision of other normal pregnancy, unspecified trimester: Secondary | ICD-10-CM | POA: Insufficient documentation

## 2022-03-20 DIAGNOSIS — Z3A11 11 weeks gestation of pregnancy: Secondary | ICD-10-CM

## 2022-03-20 DIAGNOSIS — Z8739 Personal history of other diseases of the musculoskeletal system and connective tissue: Secondary | ICD-10-CM

## 2022-03-20 DIAGNOSIS — Z3481 Encounter for supervision of other normal pregnancy, first trimester: Secondary | ICD-10-CM | POA: Diagnosis not present

## 2022-03-20 DIAGNOSIS — O210 Mild hyperemesis gravidarum: Secondary | ICD-10-CM

## 2022-03-20 DIAGNOSIS — Z981 Arthrodesis status: Secondary | ICD-10-CM

## 2022-03-20 MED ORDER — ONDANSETRON HCL 4 MG PO TABS: 4.0000 mg | ORAL_TABLET | Freq: Three times a day (TID) | ORAL | 0 refills | Status: DC | PRN

## 2022-03-20 NOTE — Progress Notes (Signed)
New OB Note  03/20/2022   Clinic: Center for [facility]  Chief Complaint: new OB  Transfer of Care Patient: no  History of Present Illness: Mallory Mcintyre is a 21 y.o. G2P1001 @ 11/3 weeks (EDC 2/25, based on 6wk u/s). Patient's last menstrual period was 12/10/2021.).  Preg complicated by has Scoliosis; Marijuana use; Alpha thalassemia silent carrier; History of spinal fusion for scoliosis; Multiple joint pain; and Supervision of other normal pregnancy, antepartum on their problem list.   No SAB s/s but has some nausea with pregnancy that has been helped with some zoffran and pt would like a refill  ROS: A 12-point review of systems was performed and negative, except as stated in the above HPI.  OBGYN History: As per HPI. OB History  Gravida Para Term Preterm AB Living  2 1 1  0 0 1  SAB IAB Ectopic Multiple Live Births  0 0 0 0 1    # Outcome Date GA Lbr Len/2nd Weight Sex Delivery Anes PTL Lv  2 Current           1 Term 05/12/19 [redacted]w[redacted]d 05:25 / 00:14 6 lb 6.5 oz (2.905 kg) F Vag-Spont None  LIV    Obstetric Comments  LMP: 03/14/2021    Any issues with any prior pregnancies: no Prior children are healthy, doing well, and without any problems or issues: yes History of pap smears: No.  Past Medical History: Past Medical History:  Diagnosis Date   Encounter for supervision of normal pregnancy, antepartum 10/20/2018    Nursing Staff Provider Office Location  [facility] Dating   LMP Language   English  Anatomy 12/20/2018   normal female  Flu Vaccine   Declined 10/20/2018 Genetic Screen  NIPS: low risk  AFP:  Negative   TDaP vaccine  Declined 04-26-19 Hgb A1C or  GTT   Third trimester 1hr GTT 79 Rhogam     LAB RESULTS  Feeding Plan  Breast Blood Type B/Positive/-- (03/10 1535)  Contraception  Undecided 04-26-19 Antibody Negati   Scoliosis     Past Surgical History: Past Surgical History:  Procedure Laterality Date   SPINAL FUSION  08/12/2013    Family History:  Family  History  Problem Relation Age of Onset   Heart disease Maternal Grandmother    Drug abuse Maternal Grandfather    COPD Maternal Grandfather    Alcohol abuse Maternal Grandfather    Diabetes Other    Asthma Other    Hypertension Other    Social History:  Social History   Socioeconomic History   Marital status: Single    Spouse name: Not on file   Number of children: Not on file   Years of education: Not on file   Highest education level: Not on file  Occupational History   Not on file  Tobacco Use   Smoking status: Some Days    Types: Cigars   Smokeless tobacco: Never   Tobacco comments:    2-3 Black n mild daily   Vaping Use   Vaping Use: Never used  Substance and Sexual Activity   Alcohol use: Yes   Drug use: Not Currently    Types: Marijuana    Comment: last smoked June 2020   Sexual activity: Yes    Partners: Male    Birth control/protection: None  Other Topics Concern   Not on file  Social History Narrative   Not on file   Social Determinants of Health   Financial Resource Strain: Not  on file  Food Insecurity: Not on file  Transportation Needs: Not on file  Physical Activity: Not on file  Stress: Not on file  Social Connections: Not on file  Intimate Partner Violence: Not on file     Allergy: No Known Allergies  Current Outpatient Medications: Prenatal vitamin. Zofran prn  Physical Exam:   BP 115/76   Pulse 96   Wt 172 lb (78 kg)   LMP 12/10/2021   BMI 28.62 kg/m  Body mass index is 28.62 kg/m. Contractions: Not present Vag. Bleeding: None. Fundal height: not applicable FHTs: 160s  General appearance: Well nourished, well developed female in no acute distress.  Neck:  Supple, normal appearance, and no thyromegaly  Cardiovascular: S1, S2 normal, no murmur, rub or gallop, regular rate and rhythm Respiratory:  Clear to auscultation bilateral. Normal respiratory effort Abdomen: positive bowel sounds and no masses, hernias; diffusely non  tender to palpation, non distended Breasts: denies any breast s/s Neuro/Psych:  Normal mood and affect.  Skin:  Warm and dry.  Lymphatic:  No inguinal lymphadenopathy.   Pelvic exam: is not limited by body habitus EGBUS: within normal limits, Vagina: within normal limits and with no blood in the vault, Cervix: normal appearing cervix without discharge or lesions, closed/long/high, Uterus:  enlarged, c/w 12-14 week size, and Adnexa:  normal adnexa and no mass, fullness, tenderness  Laboratory: none  Imaging:  No new imaging  Assessment: pt doing well  Plan: 1. Supervision of other normal pregnancy, antepartum Routine care. Offer afp next visit.  - Cytology - PAP( [place]) - CBC/D/Plt+RPR+Rh+ABO+RubIgG... - Hemoglobin A1c - Panorama Prenatal Test Full Panel - [place] MFM OB COMP + 14 WK; Future - Culture, OB Urine - ondansetron (ZOFRAN) 4 MG tablet; Take 1 tablet (4 mg total) by mouth every 8 (eight) hours as needed for nausea or vomiting.  Dispense: 20 tablet; Refill: 0 - TSH Rfx on Abnormal to Free T4 - Comprehensive metabolic panel - Protein / creatinine ratio, urine  2. Morning sickness Zofran sent in  3. History of spinal fusion for scoliosis Pt had fast labor and only used IV pain meds last delivery and doesn't want regional. Message sent to OB anesthesia to review chart to see if she thinks it would be a problem.   Problem list reviewed and updated.  Follow up in 4 weeks.  The nature of [facility] with multiple MDs and other Advanced Practice Providers was explained to patient; also emphasized that residents, students are part of our team.  >50% of 30 min visit spent on counseling and coordination of care.     Cornelia Copa MD Attending Center for [facility])

## 2022-03-20 NOTE — Progress Notes (Signed)
Patient complains of daily nausea, denies vomiting

## 2022-03-21 LAB — CBC/D/PLT+RPR+RH+ABO+RUBIGG...
Antibody Screen: NEGATIVE
Basophils Absolute: 0.1 10*3/uL (ref 0.0–0.2)
Basos: 0 %
EOS (ABSOLUTE): 0.2 10*3/uL (ref 0.0–0.4)
Eos: 1 %
HCV Ab: NONREACTIVE
HIV Screen 4th Generation wRfx: NONREACTIVE
Hematocrit: 40.4 % (ref 34.0–46.6)
Hemoglobin: 13.6 g/dL (ref 11.1–15.9)
Hepatitis B Surface Ag: NEGATIVE
Immature Grans (Abs): 0.2 10*3/uL — ABNORMAL HIGH (ref 0.0–0.1)
Immature Granulocytes: 1 %
Lymphocytes Absolute: 2.3 10*3/uL (ref 0.7–3.1)
Lymphs: 16 %
MCH: 30.6 pg (ref 26.6–33.0)
MCHC: 33.7 g/dL (ref 31.5–35.7)
MCV: 91 fL (ref 79–97)
Monocytes Absolute: 1.1 10*3/uL — ABNORMAL HIGH (ref 0.1–0.9)
Monocytes: 8 %
Neutrophils Absolute: 10.5 10*3/uL — ABNORMAL HIGH (ref 1.4–7.0)
Neutrophils: 74 %
Platelets: 308 10*3/uL (ref 150–450)
RBC: 4.44 x10E6/uL (ref 3.77–5.28)
RDW: 11.9 % (ref 11.7–15.4)
RPR Ser Ql: NONREACTIVE
Rh Factor: POSITIVE
Rubella Antibodies, IGG: 2.04 index (ref >0.99)
WBC: 14.3 10*3/uL — ABNORMAL HIGH (ref 3.4–10.8)

## 2022-03-21 LAB — COMPREHENSIVE METABOLIC PANEL
ALT: 14 IU/L (ref 0–32)
AST: 16 IU/L (ref 0–40)
Albumin/Globulin Ratio: 1.4 (ref 1.2–2.2)
Albumin: 4.2 g/dL (ref 4.0–5.0)
Alkaline Phosphatase: 71 IU/L (ref 44–121)
BUN/Creatinine Ratio: 8 — ABNORMAL LOW (ref 9–23)
BUN: 4 mg/dL — ABNORMAL LOW (ref 6–20)
Bilirubin Total: <0.2 mg/dL (ref 0.0–1.2)
CO2: 19 mmol/L — ABNORMAL LOW (ref 20–29)
Calcium: 9.5 mg/dL (ref 8.7–10.2)
Chloride: 101 mmol/L (ref 96–106)
Creatinine, Ser: 0.5 mg/dL — ABNORMAL LOW (ref 0.57–1.00)
Globulin, Total: 3 g/dL (ref 1.5–4.5)
Glucose: 77 mg/dL (ref 70–99)
Potassium: 3.7 mmol/L (ref 3.5–5.2)
Sodium: 137 mmol/L (ref 134–144)
Total Protein: 7.2 g/dL (ref 6.0–8.5)
eGFR: 136 mL/min/{1.73_m2} (ref >59)

## 2022-03-21 LAB — HCV INTERPRETATION

## 2022-03-21 LAB — HEMOGLOBIN A1C
Est. average glucose Bld gHb Est-mCnc: 108 mg/dL
Hgb A1c MFr Bld: 5.4 % (ref 4.8–5.6)

## 2022-03-21 LAB — TSH RFX ON ABNORMAL TO FREE T4: TSH: 0.658 u[IU]/mL (ref 0.450–4.500)

## 2022-03-22 ENCOUNTER — Encounter: Payer: Self-pay | Admitting: Obstetrics and Gynecology

## 2022-03-22 DIAGNOSIS — R87612 Low grade squamous intraepithelial lesion on cytologic smear of cervix (LGSIL): Secondary | ICD-10-CM | POA: Insufficient documentation

## 2022-03-22 LAB — CYTOLOGY - PAP

## 2022-03-25 LAB — PANORAMA PRENATAL TEST FULL PANEL:PANORAMA TEST PLUS 5 ADDITIONAL MICRODELETIONS: FETAL FRACTION: 9.1

## 2022-03-26 ENCOUNTER — Encounter: Payer: Self-pay | Admitting: Obstetrics and Gynecology

## 2022-04-22 ENCOUNTER — Encounter: Payer: Self-pay | Admitting: Family Medicine

## 2022-04-22 ENCOUNTER — Ambulatory Visit (INDEPENDENT_AMBULATORY_CARE_PROVIDER_SITE_OTHER): Payer: Commercial Managed Care - PPO | Admitting: Advanced Practice Midwife

## 2022-04-22 ENCOUNTER — Encounter: Payer: Self-pay | Admitting: Advanced Practice Midwife

## 2022-04-22 ENCOUNTER — Other Ambulatory Visit: Payer: Self-pay

## 2022-04-22 VITALS — BP 118/78 | HR 109 | Wt 168.0 lb

## 2022-04-22 DIAGNOSIS — R0602 Shortness of breath: Secondary | ICD-10-CM

## 2022-04-22 DIAGNOSIS — Z3A16 16 weeks gestation of pregnancy: Secondary | ICD-10-CM

## 2022-04-22 DIAGNOSIS — J069 Acute upper respiratory infection, unspecified: Secondary | ICD-10-CM

## 2022-04-22 DIAGNOSIS — Z3480 Encounter for supervision of other normal pregnancy, unspecified trimester: Secondary | ICD-10-CM

## 2022-04-22 DIAGNOSIS — O2612 Low weight gain in pregnancy, second trimester: Secondary | ICD-10-CM

## 2022-04-22 MED ORDER — ALBUTEROL SULFATE HFA 108 (90 BASE) MCG/ACT IN AERS: 2.0000 | INHALATION_SPRAY | Freq: Four times a day (QID) | RESPIRATORY_TRACT | 0 refills | Status: DC | PRN

## 2022-04-22 NOTE — Progress Notes (Addendum)
PRENATAL VISIT NOTE  Subjective:  Mallory Mcintyre is a 21 y.o. G2P1001 at [redacted]w[redacted]d being seen today for ongoing prenatal care.  She is currently monitored for the following issues for this low-risk pregnancy and has Scoliosis; Marijuana use; Alpha thalassemia silent carrier; History of spinal fusion for scoliosis; Multiple joint pain; Supervision of other normal pregnancy, antepartum; and LGSIL on Pap smear of cervix on their problem list.  Patient reports  congestion, chest tightness with shortness of breath and headache x 2 days. She has low appetite in pregnancy, no longer vomiting but not feeling like eating a meal .  Contractions: Not present. Vag. Bleeding: None.  Movement: Present. Denies leaking of fluid.   The following portions of the patient's history were reviewed and updated as appropriate: allergies, current medications, past family history, past medical history, past social history, past surgical history and problem list.   Objective:   Vitals:   04/22/22 1058  BP: 118/78  Pulse: (!) 109  Weight: 168 lb (76.2 kg)    Fetal Status: Fetal Heart Rate (bpm): 145   Movement: Present     General:  Alert, oriented and cooperative. Patient is in no acute distress.  Skin: Skin is warm and dry. No rash noted.   Cardiovascular: HEART: normal rate, heart sounds, regular rhythm RESP: normal effort, lung sounds clear and equal bilaterally   Respiratory:   Abdomen: Soft, gravid, appropriate for gestational age.  Pain/Pressure: Absent     Pelvic: Cervical exam deferred        Extremities: Normal range of motion.  Edema: None  Mental Status: Normal mood and affect. Normal behavior. Normal judgment and thought content.   Assessment and Plan:  Pregnancy: G2P1001 at [redacted]w[redacted]d 1. Supervision of other normal pregnancy, antepartum --Anticipatory guidance about next visits/weeks of pregnancy given.  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  She does not do heavy  lifting but does work with clients with wheelchairs to get onto bus and strap in, and pt who are risk of falling.  Pt to continue to discuss with her employer and with our office, and some restrictions may need to be added as pregnancy progresses.  2. Low weight gain during pregnancy in second trimester --Pt unsure of prepregnancy weight, but may be net gain in pregnancy --Lost 4 lbs since 11 week prenatal visit --Discussed ways to add calories, liquid calories like smoothies with protein may be easier than meals --High protein snacks like nuts, greek yogurt  3. [redacted] weeks gestation of pregnancy   4. URI, acute -Pt daughter sick last week, no fever. Some chills, h/a, congestion, with onset of shortness of breath/chest tightness today.  No hx asthma. --Safe OTC meds in pregnancy sent to pt per request via MyChart --Rx for inhaler for SOB symptoms. --Instructions on COVID testing given, home testing, pharmacies, Urgent Care. --If worsening symptoms, go to Urgent Care, ED , or MAU for further evaluation --Note provided for pt to we out of work 2-3 days to rest  - albuterol (VENTOLIN HFA) 108 (90 Base) MCG/ACT inhaler; Inhale 2 puffs into the lungs every 6 (six) hours as needed for wheezing or shortness of breath.  Dispense: 8 g; Refill: 0  5. Short of breath on exertion  - albuterol (VENTOLIN HFA) 108 (90 Base) MCG/ACT inhaler; Inhale 2 puffs into the lungs every 6 (six) hours as needed for wheezing or shortness of breath.  Dispense: 8 g; Refill: 0   Preterm labor symptoms  and general obstetric precautions including but not limited to vaginal bleeding, contractions, leaking of fluid and fetal movement were reviewed in detail with the patient. Please refer to After Visit Summary for other counseling recommendations.   Return in about 4 weeks (around 05/20/2022) for LOB, Any provider.  Future Appointments  Date Time Provider Department Center  05/13/2022  9:30 AM [facility]   05/20/2022  2:55 PM Autry-Lott, Randa Evens, DO [facility]    Sharen Counter, CNM

## 2022-04-22 NOTE — Progress Notes (Cosign Needed Addendum)
   PRENATAL VISIT NOTE  Subjective:  Mallory Mcintyre is a 21 y.o. G2P1001 at [redacted]w[redacted]d being seen today for ongoing prenatal care.  She is currently monitored for the following issues for this low-risk pregnancy and has Scoliosis; Marijuana use; Alpha thalassemia silent carrier; History of spinal fusion for scoliosis; Multiple joint pain; Supervision of other normal pregnancy, antepartum; and LGSIL on Pap smear of cervix on their problem list.  Patient reports fatigue, headache, no bleeding, and no contractions.  Contractions: Not present. Vag. Bleeding: None.  Movement: Present. Denies leaking of fluid.   The following portions of the patient's history were reviewed and updated as appropriate: allergies, current medications, past family history, past medical history, past social history, past surgical history and problem list.   Objective:   Vitals:   04/22/22 1058  BP: 118/78  Pulse: (!) 109  Weight: 168 lb (76.2 kg)    Fetal Status: Fetal Heart Rate (bpm): 145   Movement: Present     General:  Alert, oriented and cooperative. Patient is in no acute distress.  Skin: Skin is warm and dry. No rash noted.   Cardiovascular: Normal heart rate noted  Respiratory: Normal respiratory effort, no problems with respiration noted  Abdomen: Soft, gravid, appropriate for gestational age.  Pain/Pressure: Absent     Pelvic: Cervical exam deferred        Extremities: Normal range of motion.  Edema: None  Mental Status: Normal mood and affect. Normal behavior. Normal judgment and thought content.   Assessment and Plan:  Pregnancy: G2P1001 at [redacted]w[redacted]d    Roxana Hires, Student-PA   I confirm that I have verified the information documented in the student PA's note and that I have also personally performed the physical exam and all medical decision making activities. See my complete note.  Sharen Counter, CNM 8:48 PM

## 2022-05-06 ENCOUNTER — Encounter: Payer: Self-pay | Admitting: *Deleted

## 2022-05-13 ENCOUNTER — Ambulatory Visit: Payer: Commercial Managed Care - PPO

## 2022-05-13 ENCOUNTER — Ambulatory Visit: Payer: Commercial Managed Care - PPO | Attending: Obstetrics and Gynecology

## 2022-05-13 ENCOUNTER — Other Ambulatory Visit: Payer: Self-pay | Admitting: *Deleted

## 2022-05-13 ENCOUNTER — Other Ambulatory Visit: Payer: Self-pay | Admitting: Obstetrics and Gynecology

## 2022-05-13 DIAGNOSIS — Z362 Encounter for other antenatal screening follow-up: Secondary | ICD-10-CM

## 2022-05-13 DIAGNOSIS — Z3480 Encounter for supervision of other normal pregnancy, unspecified trimester: Secondary | ICD-10-CM | POA: Insufficient documentation

## 2022-05-13 DIAGNOSIS — Z363 Encounter for antenatal screening for malformations: Secondary | ICD-10-CM | POA: Diagnosis present

## 2022-05-13 DIAGNOSIS — Z3A19 19 weeks gestation of pregnancy: Secondary | ICD-10-CM | POA: Diagnosis not present

## 2022-05-13 DIAGNOSIS — D563 Thalassemia minor: Secondary | ICD-10-CM

## 2022-05-13 DIAGNOSIS — Z148 Genetic carrier of other disease: Secondary | ICD-10-CM | POA: Insufficient documentation

## 2022-05-20 ENCOUNTER — Encounter: Payer: Commercial Managed Care - PPO | Admitting: Family Medicine

## 2022-06-10 ENCOUNTER — Ambulatory Visit: Payer: Commercial Managed Care - PPO | Admitting: *Deleted

## 2022-06-10 ENCOUNTER — Ambulatory Visit: Payer: Commercial Managed Care - PPO | Attending: Obstetrics and Gynecology

## 2022-06-10 VITALS — BP 115/62 | HR 106

## 2022-06-10 DIAGNOSIS — Z148 Genetic carrier of other disease: Secondary | ICD-10-CM | POA: Diagnosis not present

## 2022-06-10 DIAGNOSIS — D563 Thalassemia minor: Secondary | ICD-10-CM

## 2022-06-10 DIAGNOSIS — O99012 Anemia complicating pregnancy, second trimester: Secondary | ICD-10-CM | POA: Diagnosis not present

## 2022-06-10 DIAGNOSIS — Z3480 Encounter for supervision of other normal pregnancy, unspecified trimester: Secondary | ICD-10-CM | POA: Insufficient documentation

## 2022-06-10 DIAGNOSIS — O321XX0 Maternal care for breech presentation, not applicable or unspecified: Secondary | ICD-10-CM | POA: Diagnosis not present

## 2022-06-10 DIAGNOSIS — Z3689 Encounter for other specified antenatal screening: Secondary | ICD-10-CM | POA: Diagnosis not present

## 2022-06-10 DIAGNOSIS — Z3A23 23 weeks gestation of pregnancy: Secondary | ICD-10-CM | POA: Diagnosis not present

## 2022-06-10 DIAGNOSIS — Z362 Encounter for other antenatal screening follow-up: Secondary | ICD-10-CM | POA: Diagnosis present

## 2022-07-08 ENCOUNTER — Encounter: Payer: Self-pay | Admitting: Advanced Practice Midwife

## 2022-07-18 ENCOUNTER — Encounter: Payer: Self-pay | Admitting: Certified Nurse Midwife

## 2022-07-18 ENCOUNTER — Ambulatory Visit (INDEPENDENT_AMBULATORY_CARE_PROVIDER_SITE_OTHER): Payer: Commercial Managed Care - PPO | Admitting: Certified Nurse Midwife

## 2022-07-18 ENCOUNTER — Other Ambulatory Visit: Payer: Self-pay

## 2022-07-18 VITALS — BP 119/65 | HR 99 | Wt 170.7 lb

## 2022-07-18 DIAGNOSIS — Z23 Encounter for immunization: Secondary | ICD-10-CM | POA: Diagnosis not present

## 2022-07-18 DIAGNOSIS — Z3A28 28 weeks gestation of pregnancy: Secondary | ICD-10-CM

## 2022-07-18 DIAGNOSIS — O0932 Supervision of pregnancy with insufficient antenatal care, second trimester: Secondary | ICD-10-CM

## 2022-07-18 DIAGNOSIS — Z3492 Encounter for supervision of normal pregnancy, unspecified, second trimester: Secondary | ICD-10-CM

## 2022-07-18 DIAGNOSIS — Z3480 Encounter for supervision of other normal pregnancy, unspecified trimester: Secondary | ICD-10-CM

## 2022-07-18 DIAGNOSIS — Z0289 Encounter for other administrative examinations: Secondary | ICD-10-CM

## 2022-07-18 NOTE — Progress Notes (Signed)
   PRENATAL VISIT NOTE  Subjective:  Mallory Mcintyre is a 21 y.o. G2P1001 at [redacted]w[redacted]d being seen today for ongoing prenatal care.  She is currently monitored for the following issues for this low-risk pregnancy and has Scoliosis; Marijuana use; Alpha thalassemia silent carrier; History of spinal fusion for scoliosis; Multiple joint pain; Supervision of other normal pregnancy, antepartum; and LGSIL on Pap smear of cervix on their problem list.  Patient reports no complaints.  Contractions: Irregular. Vag. Bleeding: None.  Movement: Present. Denies leaking of fluid.   The following portions of the patient's history were reviewed and updated as appropriate: allergies, current medications, past family history, past medical history, past social history, past surgical history and problem list.   Objective:   Vitals:   07/18/22 1129  BP: 119/65  Pulse: 99  Weight: 170 lb 11.2 oz (77.4 kg)    Fetal Status: Fetal Heart Rate (bpm): 147 Fundal Height: 29 cm Movement: Present     General:  Alert, oriented and cooperative. Patient is in no acute distress.  Skin: Skin is warm and dry. No rash noted.   Cardiovascular: Normal heart rate noted  Respiratory: Normal respiratory effort, no problems with respiration noted  Abdomen: Soft, gravid, appropriate for gestational age.  Pain/Pressure: Present (back and lower abd)     Pelvic: Cervical exam deferred        Extremities: Normal range of motion.  Edema: Trace  Mental Status: Normal mood and affect. Normal behavior. Normal judgment and thought content.   Assessment and Plan:  Pregnancy: G2P1001 at [redacted]w[redacted]d 1. Encounter for supervision of low-risk pregnancy in second trimester - Patient feeling frequent and vigorous fetal movement.  - Fundal height appropriate for gestational age.   2. Limited prenatal care in second trimester   3. [redacted] weeks gestation of pregnancy - Patient scheduled for GTT at next visit. Reviewed dietary restrictions for after midnight  following the night before the visit.  - Patient verbalized understanding.  - Tdap vaccine greater than or equal to 7yo IM - CBC - RPR - HIV Antibody (routine testing w rflx)  4. Supervision of other normal pregnancy, antepartum - Patient desires BTL following this baby. Reviewed that she will need to speak with an MD about the tubal. - Patient placed on MD schedule at next visit to discuss BTL.   5. Need for Tdap vaccination - Tdap vaccine greater than or equal to 7yo IM  Preterm labor symptoms and general obstetric precautions including but not limited to vaginal bleeding, contractions, leaking of fluid and fetal movement were reviewed in detail with the patient. Please refer to After Visit Summary for other counseling recommendations.   Return in about 2 weeks (around 08/01/2022) for LOB w GTT.  Future Appointments  Date Time Provider Department Center  08/08/2022  8:15 AM Adam Phenix, MD [facility]  08/08/2022  9:30 AM [facility]    [name] Danella Deis) Suzie Portela, MSN, CNM  Center for [facility]  07/18/22 1:15 PM

## 2022-07-19 LAB — CBC
Hematocrit: 33 % — ABNORMAL LOW (ref 34.0–46.6)
Hemoglobin: 10.9 g/dL — ABNORMAL LOW (ref 11.1–15.9)
MCH: 29 pg (ref 26.6–33.0)
MCHC: 33 g/dL (ref 31.5–35.7)
MCV: 88 fL (ref 79–97)
Platelets: 361 10*3/uL (ref 150–450)
RBC: 3.76 x10E6/uL — ABNORMAL LOW (ref 3.77–5.28)
RDW: 11.8 % (ref 11.7–15.4)
WBC: 12.3 10*3/uL — ABNORMAL HIGH (ref 3.4–10.8)

## 2022-07-19 LAB — RPR: RPR Ser Ql: NONREACTIVE

## 2022-07-19 LAB — HIV ANTIBODY (ROUTINE TESTING W REFLEX): HIV Screen 4th Generation wRfx: NONREACTIVE

## 2022-07-25 ENCOUNTER — Encounter: Payer: Self-pay | Admitting: *Deleted

## 2022-08-07 ENCOUNTER — Other Ambulatory Visit: Payer: Self-pay

## 2022-08-07 DIAGNOSIS — Z3492 Encounter for supervision of normal pregnancy, unspecified, second trimester: Secondary | ICD-10-CM

## 2022-08-08 ENCOUNTER — Encounter: Payer: Self-pay | Admitting: Family Medicine

## 2022-08-08 ENCOUNTER — Ambulatory Visit (INDEPENDENT_AMBULATORY_CARE_PROVIDER_SITE_OTHER): Payer: Commercial Managed Care - PPO | Admitting: Obstetrics & Gynecology

## 2022-08-08 ENCOUNTER — Other Ambulatory Visit (INDEPENDENT_AMBULATORY_CARE_PROVIDER_SITE_OTHER): Payer: Commercial Managed Care - PPO

## 2022-08-08 VITALS — BP 114/68 | HR 92 | Wt 173.2 lb

## 2022-08-08 DIAGNOSIS — O24419 Gestational diabetes mellitus in pregnancy, unspecified control: Secondary | ICD-10-CM

## 2022-08-08 DIAGNOSIS — Z3483 Encounter for supervision of other normal pregnancy, third trimester: Secondary | ICD-10-CM

## 2022-08-08 DIAGNOSIS — Z981 Arthrodesis status: Secondary | ICD-10-CM

## 2022-08-08 DIAGNOSIS — Z3A31 31 weeks gestation of pregnancy: Secondary | ICD-10-CM

## 2022-08-08 DIAGNOSIS — Z3492 Encounter for supervision of normal pregnancy, unspecified, second trimester: Secondary | ICD-10-CM

## 2022-08-08 DIAGNOSIS — Z8739 Personal history of other diseases of the musculoskeletal system and connective tissue: Secondary | ICD-10-CM

## 2022-08-08 DIAGNOSIS — Z3480 Encounter for supervision of other normal pregnancy, unspecified trimester: Secondary | ICD-10-CM

## 2022-08-08 NOTE — Progress Notes (Signed)
   PRENATAL VISIT NOTE  Subjective:  Mallory Mcintyre is a 21 y.o. G2P1001 at [redacted]w[redacted]d being seen today for ongoing prenatal care.  She is currently monitored for the following issues for this low-risk pregnancy and has Scoliosis; Marijuana use; Alpha thalassemia silent carrier; History of spinal fusion for scoliosis; Multiple joint pain; Supervision of other normal pregnancy, antepartum; and LGSIL on Pap smear of cervix on their problem list.  Patient reports headache.  Contractions: Irritability. Vag. Bleeding: None.  Movement: Present. Denies leaking of fluid.   The following portions of the patient's history were reviewed and updated as appropriate: allergies, current medications, past family history, past medical history, past social history, past surgical history and problem list.   Objective:   Vitals:   08/08/22 0833  BP: 114/68  Pulse: 92  Weight: 173 lb 3.2 oz (78.6 kg)    Fetal Status: Fetal Heart Rate (bpm): 145   Movement: Present     General:  Alert, oriented and cooperative. Patient is in no acute distress.  Skin: Skin is warm and dry. No rash noted.   Cardiovascular: Normal heart rate noted  Respiratory: Normal respiratory effort, no problems with respiration noted  Abdomen: Soft, gravid, appropriate for gestational age.  Pain/Pressure: Present     Pelvic: Cervical exam deferred        Extremities: Normal range of motion.  Edema: Trace  Mental Status: Normal mood and affect. Normal behavior. Normal judgment and thought content.   Assessment and Plan:  Pregnancy: G2P1001 at [redacted]w[redacted]d 1. Supervision of other normal pregnancy, antepartum No orders of the defined types were placed in this encounter.  2 hr GTT  2. History of spinal fusion for scoliosis Has chronic back pain but is stable  Preterm labor symptoms and general obstetric precautions including but not limited to vaginal bleeding, contractions, leaking of fluid and fetal movement were reviewed in detail with the  patient. Please refer to After Visit Summary for other counseling recommendations.   Return in about 2 weeks (around 08/22/2022).  Future Appointments  Date Time Provider Department Center  08/08/2022  9:30 AM [facility]    Scheryl Darter, MD

## 2022-08-09 LAB — CBC
Hematocrit: 33.4 % — ABNORMAL LOW (ref 34.0–46.6)
Hemoglobin: 11 g/dL — ABNORMAL LOW (ref 11.1–15.9)
MCH: 28.4 pg (ref 26.6–33.0)
MCHC: 32.9 g/dL (ref 31.5–35.7)
MCV: 86 fL (ref 79–97)
Platelets: 356 10*3/uL (ref 150–450)
RBC: 3.87 x10E6/uL (ref 3.77–5.28)
RDW: 11.8 % (ref 11.7–15.4)
WBC: 15.3 10*3/uL — ABNORMAL HIGH (ref 3.4–10.8)

## 2022-08-09 LAB — GLUCOSE TOLERANCE, 2 HOURS W/ 1HR
Glucose, 1 hour: 192 mg/dL — ABNORMAL HIGH (ref 70–179)
Glucose, 2 hour: 137 mg/dL (ref 70–152)
Glucose, Fasting: 93 mg/dL — ABNORMAL HIGH (ref 70–91)

## 2022-08-09 LAB — HIV ANTIBODY (ROUTINE TESTING W REFLEX): HIV Screen 4th Generation wRfx: NONREACTIVE

## 2022-08-09 LAB — RPR: RPR Ser Ql: NONREACTIVE

## 2022-08-12 NOTE — L&D Delivery Note (Signed)
Delivery Note:   Mallory Mcintyre [id number] at [redacted]w[redacted]d  Admitting diagnosis: Preterm labor [O60.00] Risks: A1DM  First Stage:  Induction of labor:no Onset of labor: 0300 Augmentation: AROM at C/C/+2 .  FHR 150s w/decels to 90s w/each ctx.  AROM w/clear fluid, delivered w/next ctx Active labor onset: 0300 Analgesia /Anesthesia/Pain control intrapartum: None   Second Stage:  Complete dilation at 08/16/2022  0400 Onset of pushing at 0501 FHR second stage 0502   Pushing in lithotomy position with CNM and L&D staff support, NICU present for birth and supportive. Nuchal Cord: No  Delivery of a Live born female  Birth Weight: 4 lb 2.7 oz (1890 g) APGAR: 8, 9  Newborn Delivery   Birth date/time: 08/16/2022 05:02:00 Delivery type: Vaginal, Spontaneous     APGAR: 10 min-    Infant delivered in cephalic presentation, in LOA position and restituted to LOA position.  Cord double clamped after 1 minute, cut by FOB.  Collection of cord blood for typing completed. Cord blood donation-None  Arterial cord blood sample-No    Third Stage:  Placenta delivered-Spontaneous  with 3 vessels . Uterine tone firm bleeding minimal 20u Pitocin in 500cc LR given as a bolus prior delivery of placenta Uterotonics: none Placenta to pathology.  None  laceration identified.  Episiotomy:None  Local analgesia: none  Repair:n/a Est. Blood Loss (ZW):25.85   Complications: None   Mom to postpartum.  Baby boyto NICU.  Delivery Report:   Review the Delivery Report for details.     Signed: Christin Fudge, DNP,CNM 08/16/2022, 5:54 AM

## 2022-08-13 ENCOUNTER — Encounter: Payer: Self-pay | Admitting: Obstetrics & Gynecology

## 2022-08-13 ENCOUNTER — Telehealth: Payer: Self-pay

## 2022-08-13 DIAGNOSIS — Z8632 Personal history of gestational diabetes: Secondary | ICD-10-CM | POA: Insufficient documentation

## 2022-08-13 DIAGNOSIS — O24419 Gestational diabetes mellitus in pregnancy, unspecified control: Secondary | ICD-10-CM | POA: Insufficient documentation

## 2022-08-13 NOTE — Telephone Encounter (Addendum)
-----   Message from Woodroe Mode, MD sent at 08/13/2022  4:48 PM EST ----- GDM, needs diabetes education  Attempted to call pt unable to leave message due to voicemail not set up.   Frances Nickels  08/13/22

## 2022-08-13 NOTE — Progress Notes (Signed)
GDM, needs diabetes education

## 2022-08-15 ENCOUNTER — Encounter (HOSPITAL_COMMUNITY): Payer: Self-pay | Admitting: Family Medicine

## 2022-08-15 ENCOUNTER — Inpatient Hospital Stay (HOSPITAL_BASED_OUTPATIENT_CLINIC_OR_DEPARTMENT_OTHER): Payer: Commercial Managed Care - PPO

## 2022-08-15 ENCOUNTER — Inpatient Hospital Stay (EMERGENCY_DEPARTMENT_HOSPITAL)
Admission: AD | Admit: 2022-08-15 | Discharge: 2022-08-15 | Disposition: A | Discharge status: Home / Self Care | Payer: Commercial Managed Care - PPO | Source: Home / Self Care | Attending: Family Medicine | Admitting: Family Medicine

## 2022-08-15 DIAGNOSIS — N888 Other specified noninflammatory disorders of cervix uteri: Secondary | ICD-10-CM

## 2022-08-15 DIAGNOSIS — O99891 Other specified diseases and conditions complicating pregnancy: Secondary | ICD-10-CM

## 2022-08-15 DIAGNOSIS — Z148 Genetic carrier of other disease: Secondary | ICD-10-CM

## 2022-08-15 DIAGNOSIS — O4693 Antepartum hemorrhage, unspecified, third trimester: Secondary | ICD-10-CM

## 2022-08-15 DIAGNOSIS — O4703 False labor before 37 completed weeks of gestation, third trimester: Secondary | ICD-10-CM | POA: Insufficient documentation

## 2022-08-15 DIAGNOSIS — Z3A32 32 weeks gestation of pregnancy: Secondary | ICD-10-CM | POA: Insufficient documentation

## 2022-08-15 LAB — URINALYSIS, ROUTINE W REFLEX MICROSCOPIC
Bilirubin Urine: NEGATIVE
Glucose, UA: NEGATIVE mg/dL
Ketones, ur: NEGATIVE mg/dL
Nitrite: NEGATIVE
Protein, ur: 30 mg/dL — AB
Specific Gravity, Urine: 1.015 (ref 1.005–1.030)
pH: 6 (ref 5.0–8.0)

## 2022-08-15 MED ORDER — NIFEDIPINE 10 MG PO CAPS
10.0000 mg | ORAL_CAPSULE | ORAL | Status: AC | PRN
  Administered 2022-08-15 (×3): 10 mg via ORAL
  Filled 2022-08-15 (×3): qty 1

## 2022-08-15 MED ORDER — LACTATED RINGERS IV BOLUS
1000.0000 mL | Freq: Once | INTRAVENOUS | Status: AC
  Administered 2022-08-15: 1000 mL via INTRAVENOUS

## 2022-08-15 MED ORDER — TERBUTALINE SULFATE 1 MG/ML IJ SOLN: 0.2500 mg | Freq: Once | INTRAMUSCULAR | Status: DC

## 2022-08-15 NOTE — Discharge Instructions (Signed)

## 2022-08-15 NOTE — MAU Provider Note (Signed)
History     CSN: [id number]  Arrival date and time: 08/15/22 1403   Event Date/Time   First Provider Initiated Contact with Patient 08/15/22 1452      Chief Complaint  Patient presents with   Vaginal Bleeding   Abdominal Pain   HPI  Mallory Mcintyre is a 22 y.o. G2P1001 at [redacted]w[redacted]d who presents for evaluation of vaginal bleeding. Patient reports she was sitting on the couch and felt like she had started her period. She reports she went to the bathroom and had a small amount of blood in her underwear. She states she saw it one more time when she wiped and has not seen anymore bleeding. She has not had to wear a pad. She reports intermittent cramping for several weeks. She reports feeling it today but it is no worse than before. Patient rates the pain as a 4/10 and has not tried anything for the pain.  She denies any discharge and leaking of fluid. Denies any constipation, diarrhea or any urinary complaints. Reports normal fetal movement.   OB History     Gravida  2   Para  1   Term  1   Preterm  0   AB  0   Living  1      SAB  0   IAB  0   Ectopic  0   Multiple  0   Live Births  1        Obstetric Comments  LMP: 03/14/2021         Past Medical History:  Diagnosis Date   Encounter for supervision of normal pregnancy, antepartum 10/20/2018    Nursing Staff Provider Office Location  [facility] Dating   LMP Language   English  Anatomy US   normal female  Flu Vaccine   Declined 10/20/2018 Genetic Screen  NIPS: low risk  AFP:  Negative   TDaP vaccine  Declined 04-26-19 Hgb A1C or  GTT   Third trimester 1hr GTT 79 Rhogam     LAB RESULTS  Feeding Plan  Breast Blood Type B/Positive/-- (03/10 1535)  Contraception  Undecided 04-26-19 Antibody Negati   Scoliosis     Past Surgical History:  Procedure Laterality Date   SPINAL FUSION  08/12/2013    Family History  Problem Relation Age of Onset   Heart disease Maternal Grandmother    Drug abuse Maternal Grandfather    COPD  Maternal Grandfather    Alcohol abuse Maternal Grandfather    Diabetes Other    Asthma Other    Hypertension Other     Social History   Tobacco Use   Smoking status: Some Days    Types: Cigars   Smokeless tobacco: Never   Tobacco comments:    2-3 Black n mild daily   Vaping Use   Vaping Use: Never used  Substance Use Topics   Alcohol use: Not Currently   Drug use: Not Currently    Types: Marijuana    Comment: last smoked June 2020    Allergies: No Known Allergies  No medications prior to admission.    Review of Systems  Constitutional: Negative.  Negative for fatigue and fever.  HENT: Negative.    Respiratory: Negative.  Negative for shortness of breath.   Cardiovascular: Negative.  Negative for chest pain.  Gastrointestinal:  Positive for abdominal pain. Negative for constipation, diarrhea, nausea and vomiting.  Genitourinary:  Positive for vaginal bleeding. Negative for dysuria.  Neurological: Negative.  Negative for dizziness and headaches.  Physical Exam   Blood pressure 124/63, pulse (!) 126, temperature 98.8 F (37.1 C), temperature source Oral, resp. rate 16, height 5\' 5"  (1.651 m), weight 78.5 kg, last menstrual period 12/10/2021, SpO2 100 %.  Patient Vitals for the past 24 hrs:  BP Temp Temp src Pulse Resp SpO2 Height Weight  08/15/22 1745 124/63 -- -- -- -- -- -- --  08/15/22 1744 124/63 -- -- (!) 126 -- -- -- --  08/15/22 1721 123/69 -- -- -- -- -- -- --  08/15/22 1720 123/69 -- -- (!) 120 -- 100 % -- --  08/15/22 1659 132/77 -- -- -- -- -- -- --  08/15/22 1447 119/81 -- -- (!) 101 -- -- -- --  08/15/22 1441 123/78 98.8 F (37.1 C) Oral 100 16 100 % 5\' 5"  (1.651 m) 78.5 kg    Physical Exam Vitals and nursing note reviewed.  Constitutional:      General: She is not in acute distress.    Appearance: She is well-developed.  HENT:     Head: Normocephalic.  Eyes:     Pupils: Pupils are equal, round, and reactive to light.  Cardiovascular:      Rate and Rhythm: Normal rate and regular rhythm.     Heart sounds: Normal heart sounds.  Pulmonary:     Effort: Pulmonary effort is normal. No respiratory distress.     Breath sounds: Normal breath sounds.  Abdominal:     General: Bowel sounds are normal. There is no distension.     Palpations: Abdomen is soft.     Tenderness: There is no abdominal tenderness.  Genitourinary:    Comments: SSE: small amount of brown discharge on vault, cervical friability noted Skin:    General: Skin is warm and dry.  Neurological:     Mental Status: She is alert and oriented to person, place, and time.  Psychiatric:        Mood and Affect: Mood normal.        Behavior: Behavior normal.        Thought Content: Thought content normal.        Judgment: Judgment normal.     Fetal Tracing:  Baseline: 140 Variability: moderate Accels: 15x15 Decels: none  Toco: q3-4 minutes  Dilation: 1 Effacement (%): Thick Cervical Position: Posterior Station: Ballotable Exam by:: Len Blalock, CNM   MAU Course  Procedures  Results for orders placed or performed during the hospital encounter of 08/15/22 (from the past 24 hour(s))  Urinalysis, Routine w reflex microscopic     Status: Abnormal   Collection Time: 08/15/22  2:49 PM  Result Value Ref Range   Color, Urine YELLOW YELLOW   APPearance HAZY (A) CLEAR   Specific Gravity, Urine 1.015 1.005 - 1.030   pH 6.0 5.0 - 8.0   Glucose, UA NEGATIVE NEGATIVE mg/dL   Hgb urine dipstick MODERATE (A) NEGATIVE   Bilirubin Urine NEGATIVE NEGATIVE   Ketones, ur NEGATIVE NEGATIVE mg/dL   Protein, ur 30 (A) NEGATIVE mg/dL   Nitrite NEGATIVE NEGATIVE   Leukocytes,Ua MODERATE (A) NEGATIVE   RBC / HPF 0-5 0 - 5 RBC/hpf   WBC, UA 11-20 0 - 5 WBC/hpf   Bacteria, UA RARE (A) NONE SEEN   Squamous Epithelial / HPF 6-10 0 - 5 /HPF   Mucus PRESENT       MDM Labs ordered and reviewed.   UA [place] MFM OB Limited- normal AFI with fundal placenta LR  bolus Procardia series  Patient still  feeling contractions despite interventions. Recommended terbutaline- patient declines stating she wants to go home. Recommended repeat cervical exam to be certain this is not preterm labor. Patient agreeable. Cervix unchanged, no blood noted with exam.  Assessment and Plan   1. Preterm uterine contractions in third trimester, antepartum   2. [redacted] weeks gestation of pregnancy   3. Friable cervix     -Discharge home in stable condition -Preterm labor precautions discussed -Patient advised to follow-up with OB as scheduled for prenatal care -Patient may return to MAU as needed or if her condition were to change or worsen  Rolm Bookbinder, CNM 08/15/2022, 2:52 PM

## 2022-08-15 NOTE — MAU Note (Signed)
.  Mallory Mcintyre is a 22 y.o. at [redacted]w[redacted]d here in MAU reporting: Patient reports went to the bathroom this morning and noticed bright red VB in her underwear and when she wiped. She noticed a few small clots. She is unsure of the amount of bleeding, but reports that it is more than spotting and less than a period. Patient not currently wearing a pad. Denies any recent intercourse. She also reports intermittent lower abdominal and back cramping that has been on-going for a week. Denies LOF and reports good FM.  She reports that the bleeding is much lighter now when using the restroom in MAU.  LMP: N/A Onset of complaint: on-going Pain score: 3/10 Vitals:   08/15/22 1441  BP: 123/78  Pulse: 100  Resp: 16  SpO2: 100%     FHT:140 Lab orders placed from triage:  UA

## 2022-08-15 NOTE — Telephone Encounter (Signed)
Called pt for second attempt; call cannot be completed. MyChart message sent.

## 2022-08-16 ENCOUNTER — Encounter (HOSPITAL_COMMUNITY): Payer: Self-pay | Admitting: Obstetrics and Gynecology

## 2022-08-16 ENCOUNTER — Other Ambulatory Visit: Payer: Self-pay

## 2022-08-16 ENCOUNTER — Inpatient Hospital Stay (HOSPITAL_COMMUNITY)
Admission: AD | Admit: 2022-08-16 | Discharge: 2022-08-17 | DRG: 807 | Disposition: A | Discharge status: Home / Self Care | Payer: Commercial Managed Care - PPO | Attending: Obstetrics and Gynecology | Admitting: Obstetrics and Gynecology

## 2022-08-16 DIAGNOSIS — D563 Thalassemia minor: Secondary | ICD-10-CM | POA: Diagnosis present

## 2022-08-16 DIAGNOSIS — Z981 Arthrodesis status: Secondary | ICD-10-CM

## 2022-08-16 DIAGNOSIS — Z148 Genetic carrier of other disease: Secondary | ICD-10-CM | POA: Diagnosis not present

## 2022-08-16 DIAGNOSIS — Z3A32 32 weeks gestation of pregnancy: Secondary | ICD-10-CM

## 2022-08-16 DIAGNOSIS — O24419 Gestational diabetes mellitus in pregnancy, unspecified control: Secondary | ICD-10-CM | POA: Diagnosis present

## 2022-08-16 DIAGNOSIS — O24420 Gestational diabetes mellitus in childbirth, diet controlled: Secondary | ICD-10-CM | POA: Diagnosis present

## 2022-08-16 DIAGNOSIS — Z87891 Personal history of nicotine dependence: Secondary | ICD-10-CM

## 2022-08-16 DIAGNOSIS — Z8632 Personal history of gestational diabetes: Secondary | ICD-10-CM | POA: Diagnosis present

## 2022-08-16 DIAGNOSIS — Z3480 Encounter for supervision of other normal pregnancy, unspecified trimester: Secondary | ICD-10-CM

## 2022-08-16 DIAGNOSIS — Z8751 Personal history of pre-term labor: Secondary | ICD-10-CM | POA: Diagnosis present

## 2022-08-16 HISTORY — DX: Personal history of pre-term labor: Z87.51

## 2022-08-16 LAB — CBC
HCT: 40.9 % (ref 36.0–46.0)
Hemoglobin: 13.1 g/dL (ref 12.0–15.0)
MCH: 28.3 pg (ref 26.0–34.0)
MCHC: 32 g/dL (ref 30.0–36.0)
MCV: 88.3 fL (ref 80.0–100.0)
Platelets: 375 10*3/uL (ref 150–400)
RBC: 4.63 MIL/uL (ref 3.87–5.11)
RDW: 13.2 % (ref 11.5–15.5)
WBC: 21 10*3/uL — ABNORMAL HIGH (ref 4.0–10.5)
nRBC: 0 % (ref 0.0–0.2)

## 2022-08-16 LAB — TYPE AND SCREEN
ABO/RH(D): B POS
Antibody Screen: NEGATIVE

## 2022-08-16 LAB — RPR: RPR Ser Ql: NONREACTIVE

## 2022-08-16 MED ORDER — OXYTOCIN BOLUS FROM INFUSION
333.0000 mL | Freq: Once | INTRAVENOUS | Status: AC
  Administered 2022-08-16: 333 mL via INTRAVENOUS

## 2022-08-16 MED ORDER — DIPHENHYDRAMINE HCL 25 MG PO CAPS: 25.0000 mg | ORAL_CAPSULE | Freq: Four times a day (QID) | ORAL | Status: DC | PRN

## 2022-08-16 MED ORDER — DIBUCAINE (PERIANAL) 1 % EX OINT: 1.0000 | TOPICAL_OINTMENT | CUTANEOUS | Status: DC | PRN

## 2022-08-16 MED ORDER — SIMETHICONE 80 MG PO CHEW: 80.0000 mg | CHEWABLE_TABLET | ORAL | Status: DC | PRN

## 2022-08-16 MED ORDER — ONDANSETRON HCL 4 MG/2ML IJ SOLN: 4.0000 mg | INTRAMUSCULAR | Status: DC | PRN

## 2022-08-16 MED ORDER — MEDROXYPROGESTERONE ACETATE 150 MG/ML IM SUSP: 150.0000 mg | INTRAMUSCULAR | Status: DC | PRN

## 2022-08-16 MED ORDER — LIDOCAINE HCL (PF) 1 % IJ SOLN: 30.0000 mL | INTRAMUSCULAR | Status: DC | PRN

## 2022-08-16 MED ORDER — ACETAMINOPHEN 325 MG PO TABS
650.0000 mg | ORAL_TABLET | ORAL | Status: DC | PRN
  Administered 2022-08-16: 650 mg via ORAL

## 2022-08-16 MED ORDER — SENNOSIDES-DOCUSATE SODIUM 8.6-50 MG PO TABS
2.0000 | ORAL_TABLET | ORAL | Status: DC
  Administered 2022-08-16: 2 via ORAL
  Filled 2022-08-16: qty 2

## 2022-08-16 MED ORDER — FLEET ENEMA 7-19 GM/118ML RE ENEM: 1.0000 | ENEMA | Freq: Every day | RECTAL | Status: DC | PRN

## 2022-08-16 MED ORDER — OXYTOCIN-SODIUM CHLORIDE 30-0.9 UT/500ML-% IV SOLN
2.5000 [IU]/h | INTRAVENOUS | Status: DC
  Filled 2022-08-16: qty 500

## 2022-08-16 MED ORDER — COCONUT OIL OIL
1.0000 | TOPICAL_OIL | Status: DC | PRN
  Administered 2022-08-16: 1 via TOPICAL

## 2022-08-16 MED ORDER — OXYCODONE-ACETAMINOPHEN 5-325 MG PO TABS: 1.0000 | ORAL_TABLET | ORAL | Status: DC | PRN

## 2022-08-16 MED ORDER — LACTATED RINGERS IV SOLN: 500.0000 mL | INTRAVENOUS | Status: DC | PRN

## 2022-08-16 MED ORDER — METHYLERGONOVINE MALEATE 0.2 MG/ML IJ SOLN: 0.2000 mg | INTRAMUSCULAR | Status: DC | PRN

## 2022-08-16 MED ORDER — BETAMETHASONE SOD PHOS & ACET 6 (3-3) MG/ML IJ SUSP
12.0000 mg | Freq: Once | INTRAMUSCULAR | Status: AC
  Administered 2022-08-16: 12 mg via INTRAMUSCULAR
  Filled 2022-08-16: qty 5

## 2022-08-16 MED ORDER — BENZOCAINE-MENTHOL 20-0.5 % EX AERO: 1.0000 | INHALATION_SPRAY | CUTANEOUS | Status: DC | PRN

## 2022-08-16 MED ORDER — ONDANSETRON HCL 4 MG/2ML IJ SOLN: 4.0000 mg | Freq: Four times a day (QID) | INTRAMUSCULAR | Status: DC | PRN

## 2022-08-16 MED ORDER — ACETAMINOPHEN 325 MG PO TABS
650.0000 mg | ORAL_TABLET | ORAL | Status: DC | PRN
  Filled 2022-08-16: qty 2

## 2022-08-16 MED ORDER — METHYLERGONOVINE MALEATE 0.2 MG PO TABS: 0.2000 mg | ORAL_TABLET | ORAL | Status: DC | PRN

## 2022-08-16 MED ORDER — SODIUM CHLORIDE 0.9 % IV SOLN
2.0000 g | Freq: Once | INTRAVENOUS | Status: DC
  Filled 2022-08-16: qty 2000

## 2022-08-16 MED ORDER — SODIUM CHLORIDE 0.9 % IV SOLN
1.0000 g | INTRAVENOUS | Status: DC
  Filled 2022-08-16 (×2): qty 1000

## 2022-08-16 MED ORDER — IBUPROFEN 600 MG PO TABS
600.0000 mg | ORAL_TABLET | Freq: Four times a day (QID) | ORAL | Status: DC
  Administered 2022-08-16 – 2022-08-17 (×5): 600 mg via ORAL
  Filled 2022-08-16 (×4): qty 1

## 2022-08-16 MED ORDER — OXYCODONE-ACETAMINOPHEN 5-325 MG PO TABS: 2.0000 | ORAL_TABLET | ORAL | Status: DC | PRN

## 2022-08-16 MED ORDER — TETANUS-DIPHTH-ACELL PERTUSSIS 5-2.5-18.5 LF-MCG/0.5 IM SUSY: 0.5000 mL | PREFILLED_SYRINGE | Freq: Once | INTRAMUSCULAR | Status: DC

## 2022-08-16 MED ORDER — SOD CITRATE-CITRIC ACID 500-334 MG/5ML PO SOLN: 30.0000 mL | ORAL | Status: DC | PRN

## 2022-08-16 MED ORDER — ONDANSETRON HCL 4 MG PO TABS: 4.0000 mg | ORAL_TABLET | ORAL | Status: DC | PRN

## 2022-08-16 MED ORDER — LACTATED RINGERS IV SOLN: INTRAVENOUS | Status: DC

## 2022-08-16 MED ORDER — BISACODYL 10 MG RE SUPP: 10.0000 mg | Freq: Every day | RECTAL | Status: DC | PRN

## 2022-08-16 MED ORDER — MEASLES, MUMPS & RUBELLA VAC IJ SOLR: 0.5000 mL | Freq: Once | INTRAMUSCULAR | Status: DC

## 2022-08-16 MED ORDER — PRENATAL MULTIVITAMIN CH
1.0000 | ORAL_TABLET | Freq: Every day | ORAL | Status: DC
  Administered 2022-08-16 – 2022-08-17 (×2): 1 via ORAL
  Filled 2022-08-16 (×2): qty 1

## 2022-08-16 MED ORDER — FERROUS SULFATE 325 (65 FE) MG PO TABS
325.0000 mg | ORAL_TABLET | ORAL | Status: DC
  Administered 2022-08-16: 325 mg via ORAL
  Filled 2022-08-16: qty 1

## 2022-08-16 MED ORDER — WITCH HAZEL-GLYCERIN EX PADS: 1.0000 | MEDICATED_PAD | CUTANEOUS | Status: DC | PRN

## 2022-08-16 MED ORDER — FENTANYL CITRATE (PF) 100 MCG/2ML IJ SOLN: 50.0000 ug | INTRAMUSCULAR | Status: DC | PRN

## 2022-08-16 NOTE — Plan of Care (Signed)
  Problem: Education: Goal: Knowledge of Childbirth will improve Outcome: Completed/Met Goal: Ability to make informed decisions regarding treatment and plan of care will improve Outcome: Completed/Met Goal: Ability to state and carry out methods to decrease the pain will improve Outcome: Completed/Met Goal: Individualized Educational Video(s) Outcome: Completed/Met   Problem: Coping: Goal: Ability to verbalize concerns and feelings about labor and delivery will improve Outcome: Completed/Met   Problem: Life Cycle: Goal: Ability to make normal progression through stages of labor will improve Outcome: Completed/Met Goal: Ability to effectively push during vaginal delivery will improve Outcome: Completed/Met   Problem: Role Relationship: Goal: Will demonstrate positive interactions with the child Outcome: Completed/Met   Problem: Safety: Goal: Risk of complications during labor and delivery will decrease Outcome: Completed/Met   Problem: Pain Management: Goal: Relief or control of pain from uterine contractions will improve Outcome: Completed/Met   Problem: Education: Goal: Knowledge of General Education information will improve Description: Including pain rating scale, medication(s)/side effects and non-pharmacologic comfort measures Outcome: Completed/Met   Problem: Activity: Goal: Risk for activity intolerance will decrease Outcome: Completed/Met   Problem: Nutrition: Goal: Adequate nutrition will be maintained Outcome: Completed/Met   Problem: Coping: Goal: Level of anxiety will decrease Outcome: Completed/Met   Problem: Elimination: Goal: Will not experience complications related to urinary retention Outcome: Completed/Met   Problem: Education: Goal: Knowledge of condition will improve Outcome: Completed/Met   Problem: Activity: Goal: Will verbalize the importance of balancing activity with adequate rest periods Outcome: Completed/Met Goal: Ability to  tolerate increased activity will improve Outcome: Completed/Met   Problem: Coping: Goal: Ability to identify and utilize available resources and services will improve Outcome: Completed/Met   Problem: Nutrition: Goal: Adequate nutrition will be maintained Outcome: Completed/Met   Problem: Elimination: Goal: Will not experience complications related to urinary retention Outcome: Completed/Met

## 2022-08-16 NOTE — Progress Notes (Signed)
Dr Dorena Cookey notified of pt's admission and status as documented. Only spotting now but worse abdominal pain than Thurs. Dr Dorena Cookey will come see pt

## 2022-08-16 NOTE — MAU Provider Note (Signed)
MAU Provider Note  History  [id number]  Arrival date and time: 08/16/22 0347   Chief Complaint  Patient presents with   Abdominal Pain   Vaginal Bleeding    HPI Mallory Mcintyre is a 22 y.o. G2P1001 at [redacted]w[redacted]d by [place] 6wk with PMHx notable for A1GDM, who presents for Contractions. Was here earlier on 1/4 with contraction, received procardia, then felt relief. However her pain worsened around 0130 which prompted her arrival to MAU.  Vaginal bleeding: Yes LOF: No Fetal Movement: Yes Contractions: Yes  B/Positive/-- (08/09 1042)  OB History     Gravida  2   Para  1   Term  1   Preterm  0   AB  0   Living  1      SAB  0   IAB  0   Ectopic  0   Multiple  0   Live Births  1        Obstetric Comments  LMP: 03/14/2021         Past Medical History:  Diagnosis Date   Encounter for supervision of normal pregnancy, antepartum 10/20/2018    Nursing Staff Provider Office Location  [facility] Dating   LMP Language   English  Anatomy US   normal female  Flu Vaccine   Declined 10/20/2018 Genetic Screen  NIPS: low risk  AFP:  Negative   TDaP vaccine  Declined 04-26-19 Hgb A1C or  GTT   Third trimester 1hr GTT 79 Rhogam     LAB RESULTS  Feeding Plan  Breast Blood Type B/Positive/-- (03/10 1535)  Contraception  Undecided 04-26-19 Antibody Negati   Scoliosis     Past Surgical History:  Procedure Laterality Date   SPINAL FUSION  08/12/2013    Family History  Problem Relation Age of Onset   Heart disease Maternal Grandmother    Drug abuse Maternal Grandfather    COPD Maternal Grandfather    Alcohol abuse Maternal Grandfather    Diabetes Other    Asthma Other    Hypertension Other     Social History   Socioeconomic History   Marital status: Single    Spouse name: Not on file   Number of children: Not on file   Years of education: Not on file   Highest education level: Not on file  Occupational History   Not on file  Tobacco Use   Smoking status: Former    Types:  Cigars   Smokeless tobacco: Never   Tobacco comments:    2-3 Black n mild daily   Vaping Use   Vaping Use: Never used  Substance and Sexual Activity   Alcohol use: Not Currently   Drug use: Not Currently    Types: Marijuana    Comment: last smoked June 2020   Sexual activity: Not Currently    Partners: Male    Birth control/protection: None  Other Topics Concern   Not on file  Social History Narrative   Not on file   Social Determinants of Health   Financial Resource Strain: Not on file  Food Insecurity: Not on file  Transportation Needs: Not on file  Physical Activity: Not on file  Stress: Not on file  Social Connections: Not on file  Intimate Partner Violence: Not on file    No Known Allergies  No current facility-administered medications on file prior to encounter.   Current Outpatient Medications on File Prior to Encounter  Medication Sig Dispense Refill   Acetaminophen (TYLENOL 8 HOUR PO)  Take by mouth.     Prenatal Vit-Fe Fumarate-FA (PREPLUS) 27-1 MG TABS Take 1 tablet by mouth daily. 30 tablet 13   albuterol (VENTOLIN HFA) 108 (90 Base) MCG/ACT inhaler Inhale 2 puffs into the lungs every 6 (six) hours as needed for wheezing or shortness of breath. (Patient not taking: Reported on 08/08/2022) 8 g 0   ondansetron (ZOFRAN) 4 MG tablet Take 1 tablet (4 mg total) by mouth every 8 (eight) hours as needed for nausea or vomiting. (Patient not taking: Reported on 06/10/2022) 20 tablet 0     Review of Systems  Constitutional:  Negative for appetite change, chills and fever.  HENT:  Negative for congestion, facial swelling and postnasal drip.   Eyes:  Negative for photophobia.  Respiratory:  Negative for cough and shortness of breath.   Cardiovascular:  Negative for chest pain.  Gastrointestinal:  Negative for abdominal pain, nausea and vomiting.  Endocrine: Negative for polyuria.  Genitourinary:  Negative for flank pain and pelvic pain.  Musculoskeletal:  Negative  for arthralgias.  Skin:  Negative for rash.  Neurological:  Negative for weakness and light-headedness.  Psychiatric/Behavioral:  Negative for confusion.     Pertinent positives and negative per HPI, all others reviewed and negative  Physical Exam   BP (!) 140/74   Pulse 92   Temp 99.1 F (37.3 C)   Resp 18   Ht 5\' 5"  (1.651 m)   Wt 78.2 kg   LMP 12/10/2021   SpO2 100%   BMI 28.67 kg/m   Patient Vitals for the past 24 hrs:  BP Temp Pulse Resp SpO2 Height Weight  08/16/22 0414 (!) 140/74 -- 92 -- -- -- --  08/16/22 0406 115/60 -- -- -- -- -- --  08/16/22 0402 -- 99.1 F (37.3 C) (!) 101 18 100 % 5\' 5"  (1.651 m) 78.2 kg    Physical Exam Vitals reviewed.  Constitutional:      Appearance: Normal appearance.  HENT:     Head: Normocephalic and atraumatic.     Right Ear: External ear normal.     Left Ear: External ear normal.     Nose: Nose normal.     Mouth/Throat:     Pharynx: Oropharynx is clear.  Eyes:     Conjunctiva/sclera: Conjunctivae normal.  Cardiovascular:     Rate and Rhythm: Normal rate and regular rhythm.  Pulmonary:     Effort: Pulmonary effort is normal.     Breath sounds: Normal breath sounds.  Abdominal:     General: Abdomen is flat.     Palpations: Abdomen is soft.     Comments: Gravid  Genitourinary:    General: Normal vulva.     Vagina: No vaginal discharge.     Rectum: Normal.     Comments: SVE  Dilation: 10 Station: Plus 1 Presentation: Vertex Exam by:: Mercado-ortiz DO  Skin:    General: Skin is warm.     Capillary Refill: Capillary refill takes less than 2 seconds.  Neurological:     Mental Status: Mental status is at baseline.  Psychiatric:        Mood and Affect: Mood normal.     Cervical Exam Dilation: 10 Station: Plus 1 Exam by:: Dr. Clement Husbands  Bedside Ultrasound done VERTEX PRESENTATION  Pt informed that the ultrasound is considered a limited OB ultrasound and is not intended to be a complete ultrasound exam.   Patient also informed that the ultrasound is not being completed with the intent of assessing  for fetal or placental anomalies or any pelvic abnormalities.  Explained that the purpose of today's ultrasound is to assess for  presentation.  Patient acknowledges the purpose of the exam and the limitations of the study.    FHT 134 bpm Baseline , mod variability, 15x15 accels, nodecels Toco: q 2-40min Cat: 1  Labs Results for orders placed or performed during the hospital encounter of 08/15/22 (from the past 24 hour(s))  Urinalysis, Routine w reflex microscopic     Status: Abnormal   Collection Time: 08/15/22  2:49 PM  Result Value Ref Range   Color, Urine YELLOW YELLOW   APPearance HAZY (A) CLEAR   Specific Gravity, Urine 1.015 1.005 - 1.030   pH 6.0 5.0 - 8.0   Glucose, UA NEGATIVE NEGATIVE mg/dL   Hgb urine dipstick MODERATE (A) NEGATIVE   Bilirubin Urine NEGATIVE NEGATIVE   Ketones, ur NEGATIVE NEGATIVE mg/dL   Protein, ur 30 (A) NEGATIVE mg/dL   Nitrite NEGATIVE NEGATIVE   Leukocytes,Ua MODERATE (A) NEGATIVE   RBC / HPF 0-5 0 - 5 RBC/hpf   WBC, UA 11-20 0 - 5 WBC/hpf   Bacteria, UA RARE (A) NONE SEEN   Squamous Epithelial / HPF 6-10 0 - 5 /HPF   Mucus PRESENT     Imaging No results found.  MAU Course  Procedures  Lab Orders         CBC         RPR    Meds ordered this encounter  Medications   lactated ringers infusion   oxytocin (PITOCIN) IV BOLUS FROM BAG   oxytocin (PITOCIN) IV infusion 30 units in NS 500 mL - Premix   lactated ringers infusion 500-1,000 mL   acetaminophen (TYLENOL) tablet 650 mg   oxyCODONE-acetaminophen (PERCOCET/ROXICET) 5-325 MG per tablet 1 tablet   oxyCODONE-acetaminophen (PERCOCET/ROXICET) 5-325 MG per tablet 2 tablet   fentaNYL (SUBLIMAZE) injection 50-100 mcg   ondansetron (ZOFRAN) injection 4 mg   sodium citrate-citric acid (ORACIT) solution 30 mL   lidocaine (PF) (XYLOCAINE) 1 % injection 30 mL   FOLLOWED BY Linked Order Group     ampicillin (OMNIPEN) 2 g in sodium chloride 0.9 % 100 mL IVPB     Order Specific Question:   Antibiotic Indication:     Answer:   Group B Strep Prophylaxis    ampicillin (OMNIPEN) 1 g in sodium chloride 0.9 % 100 mL IVPB     Order Specific Question:   Antibiotic Indication:     Answer:   Group B Strep Prophylaxis   Imaging Orders  No imaging studies ordered today    MDM severe  Assessment and Plan  Preterm Labor [redacted] weeks gestation Fetal Well being As above Discussed patient with RN. NST reviewed.   Pt arriving with PTL, SVE 10cm with bulging bag. Admission orders placed. Ampicillin given, betamethasone x1. NICU aware.    Dispo: Admission   Shelda Pal, [place] Fellow, Faculty practice [place] for [place] 08/16/22  4:32 AM

## 2022-08-16 NOTE — MAU Note (Signed)
.  Mallory Mcintyre is a 22 y.o. at [redacted]w[redacted]d here in MAU reporting bad abdominal cramping since 0100. Was here yesterday for cramping and VB. Only spotting now but pain much worse. Good FM. Feels like needs to have BM but cannot go.   Onset of complaint: 0100 Pain score: 7 B/P 115 60 Pulse101 T 99.1 R 18    FHT:145 Lab orders placed from triage:  ;u/a

## 2022-08-16 NOTE — Lactation Note (Signed)
This note was copied from a baby's chart.  NICU Lactation Consultation Note  Patient Name: Boy Dianely Krehbiel [id number] Date: 08/16/2022 Age:24 hours  Subjective Reason for consult: Initial assessment; NICU baby; Infant < 6lbs; Maternal endocrine disorder; Preterm <34wks  Visited with family of 10 hours pre-term NICU female; Ms. Crocker is a P2 and has some experience breastfeeding. She's already pumping and getting small droplets of colostrum, praised her for her efforts. Resize her flanges to # 24; they seemed more appropriate at this time. She doesn't have a pump for home use, she's aware of the [facility] loaner program in case she gets discharged on a weekend. Reviewed pumping schedule, lactogenesis II and anticipatory guidelines.   Objective Infant data: Mother's Current Feeding Choice: Breast Milk and Donor Milk  Infant feeding assessment Scale for Readiness: 3  Maternal data: G2P1102  Vaginal, Spontaneous Significant Breast History:: (+) breast changes during the pregnancy Current breast feeding challenges:: NICU admission Does the patient have breastfeeding experience prior to this delivery?: Yes How long did the patient breastfeed?: 3-4 months Pumping frequency: q 3 hours (recommended) Pumped volume: 0 mL (drops) Flange Size: 24 Risk factor for low milk supply:: A1GDM, prematurity, infant separation WIC Program: Yes WIC Referral Sent?: Yes [place]  Assessment Infant: Feeding Status: -- (Scheduled feedings)  Maternal: Milk volume: Normal  Intervention/Plan Interventions: Breast feeding basics reviewed; Coconut oil; DEBP; Education Tools: Pump; Flanges; Coconut oil Pump Education: Setup, frequency, and cleaning; Milk Storage  Plan of care: Encouraged pumping every 3 hours, ideally 8 pumping sessions/24 hours She'll let LC know if she would like a [employer] pump; she might file insurance through [employer] instead (she has dual insurance) to obtain a personal DEBP  No other  support person at this time. All questions and concerns answered, family to contact [facility] services PRN.  Consult Status: NICU follow-up  NICU Follow-up type: New admission follow up; Maternal D/C visit   Lenise Herald 08/16/2022, 3:46 PM

## 2022-08-16 NOTE — Progress Notes (Signed)
Patient screened out for psychosocial assessment since none of the following apply: °Psychosocial stressors documented in mother or baby's chart °Gestation less than 32 weeks °Code at delivery  °Infant with anomalies °Please contact the Clinical Social Worker if specific needs arise, by MOB's request, or if MOB scores greater than 9/yes to question 10 on Edinburgh Postpartum Depression Screen. ° °Jquan Egelston Boyd-Gilyard, MSW, LCSW °Clinical Social Work °[phone] °  °

## 2022-08-16 NOTE — Plan of Care (Signed)
  Problem: Nutrition: Goal: Adequate nutrition will be maintained Outcome: Completed/Met   Problem: Education: Goal: Knowledge of Childbirth will improve Outcome: Completed/Met Goal: Ability to make informed decisions regarding treatment and plan of care will improve Outcome: Completed/Met Goal: Ability to state and carry out methods to decrease the pain will improve Outcome: Completed/Met Goal: Individualized Educational Video(s) Outcome: Completed/Met   Problem: Coping: Goal: Ability to verbalize concerns and feelings about labor and delivery will improve Outcome: Completed/Met   Problem: Life Cycle: Goal: Ability to make normal progression through stages of labor will improve Outcome: Completed/Met Goal: Ability to effectively push during vaginal delivery will improve Outcome: Completed/Met   Problem: Role Relationship: Goal: Will demonstrate positive interactions with the child Outcome: Completed/Met   Problem: Safety: Goal: Risk of complications during labor and delivery will decrease Outcome: Completed/Met   Problem: Pain Management: Goal: Relief or control of pain from uterine contractions will improve Outcome: Completed/Met   Problem: Education: Goal: Knowledge of General Education information will improve Description: Including pain rating scale, medication(s)/side effects and non-pharmacologic comfort measures Outcome: Completed/Met   Problem: Activity: Goal: Risk for activity intolerance will decrease Outcome: Completed/Met   Problem: Nutrition: Goal: Adequate nutrition will be maintained Outcome: Completed/Met   Problem: Coping: Goal: Level of anxiety will decrease Outcome: Completed/Met   Problem: Elimination: Goal: Will not experience complications related to urinary retention Outcome: Completed/Met   Problem: Education: Goal: Knowledge of condition will improve Outcome: Completed/Met   Problem: Activity: Goal: Will verbalize the importance  of balancing activity with adequate rest periods Outcome: Completed/Met Goal: Ability to tolerate increased activity will improve Outcome: Completed/Met   Problem: Coping: Goal: Ability to identify and utilize available resources and services will improve Outcome: Completed/Met   Problem: Role Relationship: Goal: Ability to demonstrate positive interaction with newborn will improve Outcome: Completed/Met   Problem: Nutrition: Goal: Adequate nutrition will be maintained Outcome: Completed/Met   Problem: Elimination: Goal: Will not experience complications related to urinary retention Outcome: Completed/Met

## 2022-08-16 NOTE — Discharge Summary (Signed)
Postpartum Discharge Summary  Date of Service updated***     Patient Name: Mallory Mcintyre DOB: 2000-12-16 MRN: [id number]  Date of admission: 08/16/2022 Delivery date:08/16/2022  Delivering provider: Christin Fudge  Date of discharge: 08/16/2022  Admitting diagnosis: Preterm labor [O60.00] Intrauterine pregnancy: [redacted]w[redacted]d     Secondary diagnosis:  Principal Problem:   Preterm labor Active Problems:   Alpha thalassemia silent carrier   Supervision of other normal pregnancy, antepartum   Gestational diabetes mellitus (GDM) in third trimester  Additional problems: ***    Discharge diagnosis: Preterm Pregnancy Delivered and GDM A1                                              Post partum procedures:{Postpartum procedures:23558} Augmentation: AROM Complications: {OB Labor/Delivery Complications:20784}  Hospital course: Onset of Labor With Vaginal Delivery      22 y.o. yo [id number] at [redacted]w[redacted]d was admitted in Active Labor on 08/16/2022. Labor course was complicated by precipitous PTL  Membrane Rupture Time/Date: 5:01 AM ,08/16/2022   Delivery Method:Vaginal, Spontaneous  Episiotomy: None  Lacerations:  None  Patient had a postpartum course complicated by ***.  She is ambulating, tolerating a regular diet, passing flatus, and urinating well. Patient is discharged home in stable condition on 08/16/22.  Newborn Data: Birth date:08/16/2022  Birth time:5:02 AM  Gender:Female  Living status:Living  Apgars:8 ,9  Weight:1890 g   Magnesium Sulfate received: No BMZ received: Yes Rhophylac:N/A MMR:N/A T-DaP:Given prenatally Flu: No Transfusion:{Transfusion received:30440034}  Physical exam  Vitals:   08/16/22 0402 08/16/22 0406 08/16/22 0414  BP:  115/60 (!) 140/74  Pulse: (!) 101  92  Resp: 18    Temp: 99.1 F (37.3 C)    SpO2: 100%    Weight: 78.2 kg    Height: 5\' 5"  (1.651 m)     General: {Exam; general:21111117} Lochia: {Desc;  appropriate/inappropriate:30686::"appropriate"} Uterine Fundus: {Desc; firm/soft:30687} Incision: {Exam; incision:21111123} DVT Evaluation: {Exam; dvt:2111122} Labs: Lab Results  Component Value Date   WBC 15.3 (H) 08/08/2022   HGB 11.0 (L) 08/08/2022   HCT 33.4 (L) 08/08/2022   MCV 86 08/08/2022   PLT 356 08/08/2022      Latest Ref Rng & Units 03/20/2022   10:42 AM  CMP  Glucose 70 - 99 mg/dL 77   BUN 6 - 20 mg/dL 4   Creatinine 0.57 - 1.00 mg/dL 0.50   Sodium 134 - 144 mmol/L 137   Potassium 3.5 - 5.2 mmol/L 3.7   Chloride 96 - 106 mmol/L 101   CO2 20 - 29 mmol/L 19   Calcium 8.7 - 10.2 mg/dL 9.5   Total Protein 6.0 - 8.5 g/dL 7.2   Total Bilirubin 0.0 - 1.2 mg/dL <0.2   Alkaline Phos 44 - 121 IU/L 71   AST 0 - 40 IU/L 16   ALT 0 - 32 IU/L 14    Edinburgh Score:    06/16/2019   10:40 AM  Edinburgh Postnatal Depression Scale Screening Tool  I have been able to laugh and see the funny side of things. 0  I have looked forward with enjoyment to things. 0  I have blamed myself unnecessarily when things went wrong. 2  I have been anxious or worried for no good reason. 0  I have felt scared or panicky for no good reason. 0  Things have been getting on top  of me. 1  I have been so unhappy that I have had difficulty sleeping. 0  I have felt sad or miserable. 0  I have been so unhappy that I have been crying. 0  The thought of harming myself has occurred to me. 0  Edinburgh Postnatal Depression Scale Total 3     After visit meds:  Allergies as of 08/16/2022   No Known Allergies   Med Rec must be completed prior to using this [facility]***        Discharge home in stable condition Infant Feeding: {Baby feeding:23562} Infant Disposition:{CHL IP OB HOME WITH FBXUXY:33383} Discharge instruction: per After Visit Summary and Postpartum booklet. Activity: Advance as tolerated. Pelvic rest for 6 weeks.  Diet: {OB ANVB:16606004} Future Appointments: Future Appointments   Date Time Provider [place]  08/20/2022  3:15 PM Sabreena, [place], NP [facility]  08/22/2022 10:15 AM [facility] [place]   Follow up Visit:   Please schedule this patient for a Virtual postpartum visit in 4 weeks with the following provider: Any provider. Additional Postpartum F/U:2 hour GTT  Low risk pregnancy complicated by: GDM Delivery mode:  Vaginal, Spontaneous  Anticipated Birth Control:  Depo given, ***   08/16/2022 Christin Fudge, CNM

## 2022-08-16 NOTE — H&P (Signed)
History  [id number]   Arrival date and time: 08/16/22 0347      Chief Complaint  Patient presents with   Abdominal Pain   Vaginal Bleeding      HPI Mallory Mcintyre is a 22 y.o. G2P1001 at [redacted]w[redacted]d by [place] 6wk with PMHx notable for A1GDM, who presents for Contractions. Was here earlier on 1/4 with contraction, received procardia, then felt relief. However her pain worsened around 0130 which prompted her arrival to MAU.   Vaginal bleeding: Yes LOF: No Fetal Movement: Yes Contractions: Yes   B/Positive/-- (08/09 1042)   OB History       Gravida  2   Para  1   Term  1   Preterm  0   AB  0   Living  1        SAB  0   IAB  0   Ectopic  0   Multiple  0   Live Births  1          Obstetric Comments  LMP: 03/14/2021                 Past Medical History:  Diagnosis Date   Encounter for supervision of normal pregnancy, antepartum 10/20/2018     Nursing Staff  Provider Office Location            [facility]           Dating             LMP Language           English            Anatomy US    normal female  Flu Vaccine    Declined 10/20/2018  Genetic Screen            NIPS: low risk  AFP:  Negative   TDaP vaccine       Declined 04-26-19           Hgb A1C or  GTT     Third trimester 1hr GTT 79 Rhogam                                    LAB RESULTS  Feeding Plan        Breast Blood Type   B/Positive/-- (03/10 1535)  Contraception   Undecided 04-26-19    Antibody            Negati   Scoliosis             Past Surgical History:  Procedure Laterality Date   SPINAL FUSION   08/12/2013           Family History  Problem Relation Age of Onset   Heart disease Maternal Grandmother     Drug abuse Maternal Grandfather     COPD Maternal Grandfather     Alcohol abuse Maternal Grandfather     Diabetes Other     Asthma Other     Hypertension Other        Social History         Socioeconomic History   Marital status: Single      Spouse name: Not on file   Number of children:  Not on file   Years of education: Not on file   Highest education level: Not on file  Occupational History   Not on file  Tobacco Use  Smoking status: Former      Types: Cigars   Smokeless tobacco: Never   Tobacco comments:      2-3 Black n mild daily   Vaping Use   Vaping Use: Never used  Substance and Sexual Activity   Alcohol use: Not Currently   Drug use: Not Currently      Types: Marijuana      Comment: last smoked June 2020   Sexual activity: Not Currently      Partners: Male      Birth control/protection: None  Other Topics Concern   Not on file  Social History Narrative   Not on file    Social Determinants of Health    Financial Resource Strain: Not on file  Food Insecurity: Not on file  Transportation Needs: Not on file  Physical Activity: Not on file  Stress: Not on file  Social Connections: Not on file  Intimate Partner Violence: Not on file      No Known Allergies   No current facility-administered medications on file prior to encounter.          Current Outpatient Medications on File Prior to Encounter  Medication Sig Dispense Refill   Acetaminophen (TYLENOL 8 HOUR PO) Take by mouth.       Prenatal Vit-Fe Fumarate-FA (PREPLUS) 27-1 MG TABS Take 1 tablet by mouth daily. 30 tablet 13   albuterol (VENTOLIN HFA) 108 (90 Base) MCG/ACT inhaler Inhale 2 puffs into the lungs every 6 (six) hours as needed for wheezing or shortness of breath. (Patient not taking: Reported on 08/08/2022) 8 g 0   ondansetron (ZOFRAN) 4 MG tablet Take 1 tablet (4 mg total) by mouth every 8 (eight) hours as needed for nausea or vomiting. (Patient not taking: Reported on 06/10/2022) 20 tablet 0        Review of Systems  Constitutional:  Negative for appetite change, chills and fever.  HENT:  Negative for congestion, facial swelling and postnasal drip.   Eyes:  Negative for photophobia.  Respiratory:  Negative for cough and shortness of breath.   Cardiovascular:  Negative for  chest pain.  Gastrointestinal:  Negative for abdominal pain, nausea and vomiting.  Endocrine: Negative for polyuria.  Genitourinary:  Negative for flank pain and pelvic pain.  Musculoskeletal:  Negative for arthralgias.  Skin:  Negative for rash.  Neurological:  Negative for weakness and light-headedness.  Psychiatric/Behavioral:  Negative for confusion.     Pertinent positives and negative per HPI, all others reviewed and negative   Physical Exam    BP (!) 140/74   Pulse 92   Temp 99.1 F (37.3 C)   Resp 18   Ht 5\' 5"  (1.651 m)   Wt 78.2 kg   LMP 12/10/2021   SpO2 100%   BMI 28.67 kg/m    Patient Vitals for the past 24 hrs:   BP Temp Pulse Resp SpO2 Height Weight  08/16/22 0414 (!) 140/74 -- 92 -- -- -- --  08/16/22 0406 115/60 -- -- -- -- -- --  08/16/22 0402 -- 99.1 F (37.3 C) (!) 101 18 100 % 5\' 5"  (1.651 m) 78.2 kg      Physical Exam Vitals reviewed.  Constitutional:      Appearance: Normal appearance.  HENT:     Head: Normocephalic and atraumatic.     Right Ear: External ear normal.     Left Ear: External ear normal.     Nose: Nose normal.     Mouth/Throat:  Pharynx: Oropharynx is clear.  Eyes:     Conjunctiva/sclera: Conjunctivae normal.  Cardiovascular:     Rate and Rhythm: Normal rate and regular rhythm.  Pulmonary:     Effort: Pulmonary effort is normal.     Breath sounds: Normal breath sounds.  Abdominal:     General: Abdomen is flat.     Palpations: Abdomen is soft.     Comments: Gravid  Genitourinary:    General: Normal vulva.     Vagina: No vaginal discharge.     Rectum: Normal.     Comments: SVE  Dilation: 10 Station: Plus 1 Presentation: Vertex Exam by:: Mercado-ortiz DO   Skin:    General: Skin is warm.     Capillary Refill: Capillary refill takes less than 2 seconds.  Neurological:     Mental Status: Mental status is at baseline.  Psychiatric:        Mood and Affect: Mood normal.        Cervical Exam Dilation:  10 Station: Plus 1 Exam by:: Dr. Lanae Crumbly   Bedside Ultrasound done VERTEX PRESENTATION   Pt informed that the ultrasound is considered a limited OB ultrasound and is not intended to be a complete ultrasound exam.  Patient also informed that the ultrasound is not being completed with the intent of assessing for fetal or placental anomalies or any pelvic abnormalities.  Explained that the purpose of today's ultrasound is to assess for  presentation.  Patient acknowledges the purpose of the exam and the limitations of the study.     FHT 134 bpm Baseline , mod variability, 15x15 accels, nodecels Toco: q 2-46min Cat: 1   Labs Lab Results Last 24 Hours       Results for orders placed or performed during the hospital encounter of 08/15/22 (from the past 24 hour(s))  Urinalysis, Routine w reflex microscopic     Status: Abnormal    Collection Time: 08/15/22  2:49 PM  Result Value Ref Range    Color, Urine YELLOW YELLOW    APPearance HAZY (A) CLEAR    Specific Gravity, Urine 1.015 1.005 - 1.030    pH 6.0 5.0 - 8.0    Glucose, UA NEGATIVE NEGATIVE mg/dL    Hgb urine dipstick MODERATE (A) NEGATIVE    Bilirubin Urine NEGATIVE NEGATIVE    Ketones, ur NEGATIVE NEGATIVE mg/dL    Protein, ur 30 (A) NEGATIVE mg/dL    Nitrite NEGATIVE NEGATIVE    Leukocytes,Ua MODERATE (A) NEGATIVE    RBC / HPF 0-5 0 - 5 RBC/hpf    WBC, UA 11-20 0 - 5 WBC/hpf    Bacteria, UA RARE (A) NONE SEEN    Squamous Epithelial / HPF 6-10 0 - 5 /HPF    Mucus PRESENT          Nursing Staff Provider  Office Location  [facility] Dating  6wk u/s  [facility] Model [ ]  Traditional [ ]  Centering [ ]  Mom-Baby Dyad    Language  English Anatomy  [ ]  f/u one month completeion u/s  Flu Vaccine  Declined 07/18/22 Genetic/Carrier Screen  NIPS:  LR panorama AFP:    [place]:  TDaP Vaccine   07/18/22 Hgb A1C or  GTT Early wnl Third trimester   Latest Reference Range & Units Most Recent  Glucose, 1 hour 70 - 179 mg/dL [place]  (H) 14/7/23  Glucose, Fasting 70 - 91 mg/dL 93 (H) 14/7/23 854  Glucose, 2 hour 70 - 152 mg/dL 62/70/35 00:93 81/82/99  (  H): Data is abnormally high  COVID Vaccine    LAB RESULTS   Rhogam  N/A Blood Type   B pos  Baby Feeding Plan Breastfeeding Antibody  neg  Contraception BTL Rubella  imm  Circumcision  RPR NON REACTIVE (12/05 1820)   Pediatrician   HBsAg   neg  Support Person  HCVAb neg  Prenatal Classes  HIV Non Reactive (12/05 1820)     BTL Consent Desires  GBS   (For PCN allergy, check sensitivities)   VBAC Consent  Pap LSIL>rpt pap aug 2024       DME Rx [ ]  BP cuff [ ]  Weight Scale Waterbirth  [ ]  Class [ ]  Consent [ ]  CNM visit  PHQ9 & GAD7 [  ] new OB [  ] 28 weeks  [  ] 36 weeks Induction  [ ]  Orders Entered [ ] Foley Y/N   Imaging No results found.   MAU Course  Procedures   Lab Orders         CBC         RPR           Meds ordered this encounter  Medications   lactated ringers infusion   oxytocin (PITOCIN) IV BOLUS FROM BAG   oxytocin (PITOCIN) IV infusion 30 units in NS 500 mL - Premix   lactated ringers infusion 500-1,000 mL   acetaminophen (TYLENOL) tablet 650 mg   oxyCODONE-acetaminophen (PERCOCET/ROXICET) 5-325 MG per tablet 1 tablet   oxyCODONE-acetaminophen (PERCOCET/ROXICET) 5-325 MG per tablet 2 tablet   fentaNYL (SUBLIMAZE) injection 50-100 mcg   ondansetron (ZOFRAN) injection 4 mg   sodium citrate-citric acid (ORACIT) solution 30 mL   lidocaine (PF) (XYLOCAINE) 1 % injection 30 mL   FOLLOWED BY Linked Order Group     ampicillin (OMNIPEN) 2 g in sodium chloride 0.9 % 100 mL IVPB        Order Specific Question:   Antibiotic Indication:         Answer:   Group B Strep Prophylaxis      ampicillin (OMNIPEN) 1 g in sodium chloride 0.9 % 100 mL IVPB        Order Specific Question:   Antibiotic Indication:         Answer:   Group B Strep Prophylaxis     Imaging Orders  No imaging studies ordered today      MDM severe   Assessment and  Plan  Preterm Labor [redacted] weeks gestation Fetal Well being As above Discussed patient with RN. NST reviewed.    Pt arriving with PTL, SVE 10cm with bulging bag. Admission orders placed. Ampicillin given, betamethasone x1. NICU aware.      Dispo: Admission     Shelda Pal, [place] Fellow, Faculty practice [place] for [place] 08/16/22  4:32 AM

## 2022-08-16 NOTE — Progress Notes (Signed)
Unit RN at bedside states CRNA was able to gain PIV access. No further access needed at this time

## 2022-08-17 ENCOUNTER — Ambulatory Visit: Payer: Self-pay

## 2022-08-17 MED ORDER — IBUPROFEN 600 MG PO TABS: 600.0000 mg | ORAL_TABLET | Freq: Four times a day (QID) | ORAL | 0 refills | Status: DC

## 2022-08-17 MED ORDER — MEDROXYPROGESTERONE ACETATE 150 MG/ML IM SUSP: 150.0000 mg | INTRAMUSCULAR | Status: DC

## 2022-08-17 NOTE — Lactation Note (Signed)
This note was copied from a baby's chart.  NICU Lactation Consultation Note  Patient Name: Mallory Mcintyre Date: 08/17/2022 Age:24 hours  Subjective Reason for consult: Follow-up assessment; Maternal discharge; Maternal endocrine disorder; NICU baby; Infant < 6lbs; Preterm <34wks  Visited with family of 6 hours old pre-term NICU female; Mallory Mcintyre is a P2 and reports she's been pumping but not consistently. Explained the importance of consistent pumping for the onset of lactogenesis II and the prevention of engorgement. She was discharged today. She showed interest in getting the [facility] loaner pump until she can get her pump from [facility], asked her to call in case she decides going that route to file paperwork and issue the pump. Reviewed discharge education, pumping schedule, lactogenesis II, benefits of premature milk, IDF 1/2 and anticipatory guidelines.   Objective Infant data: Mother's Current Feeding Choice: Breast Milk and Donor Milk  Infant feeding assessment Scale for Readiness: 2  Maternal data: G2P1102  Vaginal, Spontaneous Significant Breast History:: (+) breast changes during the pregnancy Current breast feeding challenges:: NICU admission Does the patient have breastfeeding experience prior to this delivery?: Yes How long did the patient breastfeed?: 3-4 months Pumping frequency: 3 times/24 hours Pumped volume: 0 mL (drops) Flange Size: 24 Risk factor for low milk supply:: A1GDM, prematurity, infant separation WIC Program: Yes WIC Referral Sent?: Yes  Assessment Infant: Feeding Status: -- (Scheduled feedings)  Maternal: Milk volume: Normal  Intervention/Plan Interventions: Breast feeding basics reviewed; DEBP; Education; Infant Driven Feeding Algorithm education; [employer] brochure Tools: Pump; Flanges Pump Education: Setup, frequency, and cleaning; Milk Storage  Plan of care: Encouraged pumping every 3 hours, ideally 8 pumping sessions/24 hours She'll let  baby's RN know to call for lactation if she decides doing the [facility] loaner pump tonight or tomorrow She'll schedule an appt with the [facility] office in [place] to P/U her pump   Female visitor present. All questions and concerns answered, family to contact [facility] services PRN.  Consult Status: NICU follow-up  NICU Follow-up type: Verify onset of copious milk; Verify absence of engorgement; Weekly NICU follow up   [place] 08/17/2022, 6:38 PM

## 2022-08-17 NOTE — Plan of Care (Signed)
  Problem: Health Behavior/Discharge Planning: Goal: Ability to manage health-related needs will improve Outcome: Completed/Met   Problem: Clinical Measurements: Goal: Ability to maintain clinical measurements within normal limits will improve Outcome: Completed/Met Goal: Will remain free from infection Outcome: Completed/Met Goal: Diagnostic test results will improve Outcome: Completed/Met Goal: Respiratory complications will improve Outcome: Completed/Met Goal: Cardiovascular complication will be avoided Outcome: Completed/Met   Problem: Elimination: Goal: Will not experience complications related to bowel motility Outcome: Completed/Met   Problem: Pain Managment: Goal: General experience of comfort will improve Outcome: Completed/Met   Problem: Safety: Goal: Ability to remain free from injury will improve Outcome: Completed/Met   Problem: Skin Integrity: Goal: Risk for impaired skin integrity will decrease Outcome: Completed/Met   Problem: Education: Goal: Individualized Educational Video(s) Outcome: Completed/Met Goal: Individualized Newborn Educational Video(s) Outcome: Completed/Met   Problem: Life Cycle: Goal: Chance of risk for complications during the postpartum period will decrease Outcome: Completed/Met   Problem: Skin Integrity: Goal: Demonstration of wound healing without infection will improve Outcome: Completed/Met   Problem: Education: Goal: Knowledge of General Education information will improve Description: Including pain rating scale, medication(s)/side effects and non-pharmacologic comfort measures Outcome: Completed/Met   Problem: Health Behavior/Discharge Planning: Goal: Ability to manage health-related needs will improve Outcome: Completed/Met   Problem: Clinical Measurements: Goal: Ability to maintain clinical measurements within normal limits will improve Outcome: Completed/Met Goal: Will remain free from infection Outcome:  Completed/Met Goal: Diagnostic test results will improve Outcome: Completed/Met Goal: Respiratory complications will improve Outcome: Completed/Met Goal: Cardiovascular complication will be avoided Outcome: Completed/Met   Problem: Activity: Goal: Risk for activity intolerance will decrease Outcome: Completed/Met   Problem: Coping: Goal: Level of anxiety will decrease Outcome: Completed/Met   Problem: Elimination: Goal: Will not experience complications related to bowel motility Outcome: Completed/Met   Problem: Pain Managment: Goal: General experience of comfort will improve Outcome: Completed/Met   Problem: Safety: Goal: Ability to remain free from injury will improve Outcome: Completed/Met   Problem: Skin Integrity: Goal: Risk for impaired skin integrity will decrease Outcome: Completed/Met

## 2022-08-20 ENCOUNTER — Inpatient Hospital Stay (HOSPITAL_COMMUNITY)
Admission: AD | Admit: 2022-08-20 | Discharge: 2022-08-20 | Disposition: A | Discharge status: Home / Self Care | Payer: Commercial Managed Care - PPO | Attending: Obstetrics & Gynecology | Admitting: Obstetrics & Gynecology

## 2022-08-20 ENCOUNTER — Encounter: Payer: Commercial Managed Care - PPO | Admitting: Student

## 2022-08-20 ENCOUNTER — Ambulatory Visit: Payer: Self-pay

## 2022-08-20 ENCOUNTER — Encounter (HOSPITAL_COMMUNITY): Payer: Self-pay | Admitting: Obstetrics & Gynecology

## 2022-08-20 DIAGNOSIS — E860 Dehydration: Secondary | ICD-10-CM | POA: Diagnosis not present

## 2022-08-20 DIAGNOSIS — O909 Complication of the puerperium, unspecified: Secondary | ICD-10-CM | POA: Insufficient documentation

## 2022-08-20 DIAGNOSIS — I2699 Other pulmonary embolism without acute cor pulmonale: Secondary | ICD-10-CM | POA: Diagnosis not present

## 2022-08-20 DIAGNOSIS — R002 Palpitations: Secondary | ICD-10-CM | POA: Insufficient documentation

## 2022-08-20 DIAGNOSIS — O9089 Other complications of the puerperium, not elsewhere classified: Secondary | ICD-10-CM | POA: Diagnosis not present

## 2022-08-20 DIAGNOSIS — O1205 Gestational edema, complicating the puerperium: Secondary | ICD-10-CM

## 2022-08-20 DIAGNOSIS — O1495 Unspecified pre-eclampsia, complicating the puerperium: Secondary | ICD-10-CM | POA: Insufficient documentation

## 2022-08-20 DIAGNOSIS — G44209 Tension-type headache, unspecified, not intractable: Secondary | ICD-10-CM | POA: Diagnosis not present

## 2022-08-20 LAB — SURGICAL PATHOLOGY

## 2022-08-20 LAB — URINALYSIS, ROUTINE W REFLEX MICROSCOPIC
Bilirubin Urine: NEGATIVE
Glucose, UA: NEGATIVE mg/dL
Ketones, ur: NEGATIVE mg/dL
Nitrite: NEGATIVE
Protein, ur: 30 mg/dL — AB
RBC / HPF: >50 RBC/hpf — ABNORMAL HIGH (ref 0–5)
Specific Gravity, Urine: 1.02 (ref 1.005–1.030)
pH: 6 (ref 5.0–8.0)

## 2022-08-20 MED ORDER — ACETAMINOPHEN-CAFFEINE 500-65 MG PO TABS
1.0000 | ORAL_TABLET | Freq: Once | ORAL | Status: AC
  Administered 2022-08-20: 1 via ORAL
  Filled 2022-08-20: qty 1

## 2022-08-20 NOTE — Discharge Instructions (Signed)
Return to the MAU if your Headache does not get better after eating and drinking a good amount of fluid Your fluttering is worsening or not improving Your swelling is getting work You develop shortness of breath  Or any other concern   Enjoy your tacos!

## 2022-08-20 NOTE — MAU Provider Note (Signed)
History     CSN: [id number]  Arrival date and time: 08/20/22 2002   Event Date/Time   First Provider Initiated Contact with Patient 08/20/22 2111      Chief Complaint  Patient presents with   Headache   Palpitations   HPI Mallory Mcintyre is a 22 y.o. G2P1102 who is 4 days postpartum after preterm SVD at [redacted]w[redacted]d. Pregnancy was complicated by GDM. Patient reports she started having a headache today, located in front of head. She reports taking tylenol at 4:30 PM. She is not eating and drinking normally due to disrupted schedule due to infant in NICU. She was up in the NICU prior to coming down to the MAU. She reports she is drinking more caffiene recently and rarely urinates. She does work for [employer] and is used to holding urine for a long time. She is breastfeeding/pumping as well. She has not eaten dinner yet.  Mallory Mcintyre also endorsed palpitations or a "funny feeling" in her chest. She denies chest pain, SOB. She reports swelling in her bilateral feet. Denies changes in vision, focal weakness, trouble with walking or speaking.   OB History     Gravida  2   Para  2   Term  1   Preterm  1   AB  0   Living  2      SAB  0   IAB  0   Ectopic  0   Multiple  0   Live Births  2        Obstetric Comments  LMP: 03/14/2021         Past Medical History:  Diagnosis Date   Encounter for supervision of normal pregnancy, antepartum 10/20/2018    Nursing Staff Provider Office Location  [facility] Dating   LMP Language   English  Anatomy US   normal female  Flu Vaccine   Declined 10/20/2018 Genetic Screen  NIPS: low risk  AFP:  Negative   TDaP vaccine  Declined 04-26-19 Hgb A1C or  GTT   Third trimester 1hr GTT 79 Rhogam     LAB RESULTS  Feeding Plan  Breast Blood Type B/Positive/-- (03/10 1535)  Contraception  Undecided 04-26-19 Antibody Negati   Scoliosis     Past Surgical History:  Procedure Laterality Date   SPINAL FUSION  08/12/2013    Family History  Problem Relation Age of Onset    Heart disease Maternal Grandmother    Drug abuse Maternal Grandfather    COPD Maternal Grandfather    Alcohol abuse Maternal Grandfather    Diabetes Other    Asthma Other    Hypertension Other     Social History   Tobacco Use   Smoking status: Former    Types: Cigars   Smokeless tobacco: Never   Tobacco comments:    2-3 Black n mild daily   Vaping Use   Vaping Use: Never used  Substance Use Topics   Alcohol use: Not Currently   Drug use: Not Currently    Types: Marijuana    Comment: last smoked June 2020    Allergies: No Known Allergies  No medications prior to admission.    Review of Systems  Constitutional:  Negative for chills and fever.  HENT:  Negative for congestion and sore throat.   Eyes:  Negative for pain and visual disturbance.  Respiratory:  Negative for cough, chest tightness and shortness of breath.   Cardiovascular:  Positive for palpitations and leg swelling. Negative for chest pain.  Gastrointestinal:  Negative for abdominal pain, diarrhea, nausea and vomiting.  Endocrine: Negative for cold intolerance and heat intolerance.  Genitourinary:  Negative for dysuria and flank pain.  Musculoskeletal:  Negative for back pain and gait problem.  Skin:  Negative for rash.  Allergic/Immunologic: Negative for food allergies.  Neurological:  Positive for headaches. Negative for dizziness, syncope, speech difficulty, weakness, light-headedness and numbness.  Psychiatric/Behavioral:  Negative for agitation.    Physical Exam   Blood pressure 134/77, pulse (!) 52, temperature 98.3 F (36.8 C), resp. rate 18, height 5\' 5"  (1.651 m), weight 77.6 kg, SpO2 100 %, unknown if currently breastfeeding.  Physical Exam Vitals and nursing note reviewed.  Constitutional:      General: She is not in acute distress.    Appearance: She is well-developed.  HENT:     Head: Normocephalic and atraumatic.  Eyes:     General: No scleral icterus.    Conjunctiva/sclera:  Conjunctivae normal.  Cardiovascular:     Rate and Rhythm: Regular rhythm. Bradycardia present.     Chest Wall: PMI is not displaced.     Pulses: Normal pulses.     Heart sounds: Normal heart sounds. No murmur heard.    No friction rub. No gallop.  Pulmonary:     Effort: Pulmonary effort is normal.  Chest:     Chest wall: No tenderness.  Abdominal:     Palpations: Abdomen is soft.     Tenderness: There is no abdominal tenderness. There is no guarding or rebound.  Genitourinary:    Vagina: Normal.  Musculoskeletal:        General: Swelling: mild bradycardia 52-55 here in MAU. Normal range of motion.     Cervical back: Normal range of motion and neck supple.     Right lower leg: 1+ Pitting Edema present.     Left lower leg: 1+ Pitting Edema present.  Skin:    General: Skin is warm and dry.     Findings: No rash.  Neurological:     General: No focal deficit present.     Mental Status: She is alert and oriented to person, place, and time.     GCS: GCS eye subscore is 4. GCS verbal subscore is 5. GCS motor subscore is 6.     Cranial Nerves: Cranial nerves 2-12 are intact. No cranial nerve deficit, dysarthria or facial asymmetry.     Sensory: Sensation is intact.     Motor: Motor function is intact.     Coordination: Coordination is intact.     MAU Course  Procedures  MDM: moderate  This patient presents to the ED for concern of   Chief Complaint  Patient presents with   Headache   Palpitations     These complains involves an extensive number of treatment options, and is a complaint that carries with it a high risk of complications and morbidity.  The differential diagnosis for  1. Headache INCLUDES postpartum preeclampsia, tension headache, migraine, dehydration headache 2. Palpitations INCLUDES cardiac arrhythmia, medication side effect, caffiene side effect, pulmonary embolism, MI.  Co morbidities that complicate the patient evaluation: gestational diabetes, recent  preterm delivery  Interpreter services used: n/NA  External records from outside source obtained and reviewed including CareEverywhere and Prenatal care records  Lab Tests: Other offered testing to patient -- CMP and patient declined testing today  I ordered, and personally interpreted labs. NA  Imaging Studies ordered: None  Cardiac Testing/Monitoring:  EKG was ordered today. I personally reviewed and interpreted the  result. EKG was significant for mild bradycardia with no abnormal intervals or morphology, noted flipped Twaves in V1 and V2 but all other were normal and there were not flipped T waves in II, III, aVF that would be concern for posterior ischemia. .    Medicines ordered and prescription drug management:  Medications: Excedrin (given), offered Benadryl, Flexeril, Reglan but patient declined. Offered IV hydration with LR bolus and patient declined.    Test Considered: CMP, to assess hydration and electrolyte balance  Critical Interventions: None  MAU Course: - I ordered an EKG after seeing patient's mild bradycardia and complaint. I also ordered an excedrin which patient took - Discussed with patient most likely hydration related and offered additional medication and IVF. Patient declined these intervention. She just wanted to "make sure everything was OK" -Discussed having additional medication for headache, patient drover herself and does not want sedating medication.  - Reviewed her normal vital signs, EKG and exam. I discussed the low likelihood of life threatening complications. Patient preferred discharge to home hydration and management of headache.    After the interventions noted above, I reevaluated the patient and found that they have: improved  Dispostion: discharged   Assessment and Plan  Acute non intractable tension-type headache - Plan: Discharge patient  Dehydration  Postpartum edema  Most likely dehydration related HA Recommended PO hydration  and eating  Reviewed return precautions in detail   Caren Macadam 08/20/2022, 10:01 PM

## 2022-08-20 NOTE — Lactation Note (Signed)
This note was copied from a baby's chart.  NICU Lactation Consultation Note  Patient Name: Mallory Mcintyre [id number]'[id number] Date: 08/20/2022 Age:22 years  Subjective Reason for consult: Follow-up assessment; Maternal endocrine disorder; NICU baby; Infant < 6lbs; Preterm <34wks  Visited with family of 72 55/7 weeks old NICU female for 4 days post-partum check up. Ms. Charon reports that her milk came in but that her volumes are still modest; she's using her hand pump at home and still trying to pump every 3 hours. Provided a pumping band in size L for hands on pumping. She had already called the [facility] office and has an appt tomorrow. Martin called this morning to inquire about patient's phone not in service, encouraged mom to provide  [facility] with a back up number in case they need to reach out to her. She was pumping when entered the room and had some questions. Reviewed pumping schedule, lactogenesis II/III, engorgement prevention/treatment and anticipatory guidelines.  Objective Infant data: Mother's Current Feeding Choice: Breast Milk and Donor Milk  Infant feeding assessment Scale for Readiness: 2  Maternal data: G2P1102  Vaginal, Spontaneous Pumping frequency: 4-5 times/24 hours Pumped volume: 23 mL Flange Size: 24 WIC Program: Yes WIC Referral Sent?: Yes Pump: Manual  Assessment Infant: In NICU  Maternal: Milk volume: Normal  Intervention/Plan Interventions: Breast feeding basics reviewed; DEBP; Education Tools: Pump; Flanges; Coconut oil; Hands-free pumping top (Size S/M) Pump Education: Setup, frequency, and cleaning; Milk Storage  Plan of care: Encouraged hands-on bilateral pumping every 3 hours, ideally 8 pumping sessions/24 hours She'll pick up her pump at the [facility] office in [place] on 08/21/2022 at 3 pm   No other support person at this time. All questions and concerns answered, family to contact [facility] services PRN.  Consult Status: NICU  follow-up  NICU Follow-up type: Weekly NICU follow up   [place] 08/20/2022, 6:58 PM

## 2022-08-20 NOTE — MAU Note (Signed)
.  Mallory Mcintyre is a 22 y.o. at Unknown here in MAU reporting headache since yesterday. Light makes h/a worse. Took Tylenol yesterday and it helped but today Tylenol is not helping. My heart feels "funny". When I lay down it is hard to breathe and sometimes when I stand. Feels like my heart is fluttering. Feet are swelling.  LMP: n/a Onset of complaint: yesterday Pain score: 8 for h/a Vitals:   08/20/22 2010 08/20/22 2013  BP:  130/82  Pulse: (!) 55   Resp: 18   Temp: 98.3 F (36.8 C)   SpO2: 100%      FHT:n/a Lab orders placed from triage:  u/a

## 2022-08-22 ENCOUNTER — Other Ambulatory Visit: Payer: Commercial Managed Care - PPO

## 2022-08-26 ENCOUNTER — Telehealth (HOSPITAL_COMMUNITY): Payer: Self-pay | Admitting: *Deleted

## 2022-08-26 NOTE — Telephone Encounter (Signed)
Call cannot be completed.  Odis Hollingshead, RN 08-26-2022 at 9:24am

## 2022-09-01 ENCOUNTER — Ambulatory Visit: Payer: Self-pay

## 2022-09-01 NOTE — Lactation Note (Signed)
This note was copied from a baby's chart.  NICU Lactation Consultation Note  Patient Name: Mallory Mcintyre BWLSL'H Date: 09/01/2022 Age:24 wk.o.  Subjective Reason for consult: Follow-up assessment; Maternal endocrine disorder; NICU baby; Late-preterm 34-36.6wks; Infant < 6lbs  Visited with family of 54 67/15 weeks old (adjusted) NICU female; Ms. Agrusa is a P2 and reports she is now continues pumping consistently but skipping her pumping sessions at night; her supply has  slightly increased; praised her for her efforts. She voiced mild discomfort when pumping; resized her flanges to # 21; they seem an appropriate fit at this time. Baby Mallory Mcintyre is getting discharged today. Reviewed discharge education and the importance of continuing pumping after feedings at the breast to protect her supply. She hasn't put baby to breast while in the NICU, but her plan is to do a combination of direct breastfeeding and pumping & bottle feeding along with formula. She politely declined a referral to [facility]. she said she'll wait and see how things go once baby is home. No other support person at this time, all questions and concerns answered, family to contact [facility] services PRN.  Objective Infant data: Mother's Current Feeding Choice: Breast Milk and Formula  Infant feeding assessment Scale for Readiness: 1 Scale for Quality: 2  Maternal data: G2P1102  Vaginal, Spontaneous Pumping frequency: 6 times/24 hours Pumped volume: 30 mL (30-45 ml; up  to 90 in the AM or with power pumping) Flange Size: 24; 21 WIC Program: Yes WIC Referral Sent?: Yes Pump: Personal (WIC pump)  Assessment Infant: Feeding Status: Ad lib  Maternal: Milk volume: Low   Intervention/Plan Interventions: Breast feeding basics reviewed; DEBP; Education Tools: Pump; Flanges Pump Education: Setup, frequency, and cleaning; Milk Storage  Plan: Consult Status: Complete   Seif Teichert S Brea[place]na Gottlieb 09/01/2022, 2:39 PM

## 2022-09-19 ENCOUNTER — Other Ambulatory Visit: Payer: Self-pay | Admitting: *Deleted

## 2022-09-19 DIAGNOSIS — O24429 Gestational diabetes mellitus in childbirth, unspecified control: Secondary | ICD-10-CM

## 2022-09-26 ENCOUNTER — Other Ambulatory Visit: Payer: Commercial Managed Care - PPO

## 2022-09-26 ENCOUNTER — Ambulatory Visit: Payer: Self-pay | Admitting: Obstetrics & Gynecology

## 2022-11-04 ENCOUNTER — Encounter: Payer: Self-pay | Admitting: Family Medicine

## 2022-11-27 ENCOUNTER — Encounter: Payer: Self-pay | Admitting: Family Medicine

## 2022-11-27 ENCOUNTER — Encounter: Payer: Self-pay | Admitting: Advanced Practice Midwife

## 2022-11-27 ENCOUNTER — Ambulatory Visit (INDEPENDENT_AMBULATORY_CARE_PROVIDER_SITE_OTHER): Payer: Commercial Managed Care - PPO | Admitting: Advanced Practice Midwife

## 2022-11-27 ENCOUNTER — Other Ambulatory Visit: Payer: Self-pay

## 2022-11-27 VITALS — BP 132/78 | HR 94 | Wt 180.1 lb

## 2022-11-27 DIAGNOSIS — O09899 Supervision of other high risk pregnancies, unspecified trimester: Secondary | ICD-10-CM | POA: Insufficient documentation

## 2022-11-27 DIAGNOSIS — Z3687 Encounter for antenatal screening for uncertain dates: Secondary | ICD-10-CM

## 2022-11-27 DIAGNOSIS — Z3201 Encounter for pregnancy test, result positive: Secondary | ICD-10-CM

## 2022-11-27 DIAGNOSIS — Z8751 Personal history of pre-term labor: Secondary | ICD-10-CM

## 2022-11-27 HISTORY — DX: Supervision of other high risk pregnancies, unspecified trimester: O09.899

## 2022-11-27 NOTE — Progress Notes (Signed)
Pt here today for pp visit in which she delivered on 08/15/2022. However, pt reports that she had a +UPT at home.  UPT positive today. Pt denies VB and pain.  Pt reports LMP 09/14/22 but is unsure.  By pt's reported LMP EDD 06/21/23 and pt [redacted]w[redacted]d today.  Dating U/S scheduled for 12/10/22 at 1100 due to pt being unsure of LMP.  Medications/allergies reviewed.  Pt advised to check out at the front to schedule NEW OB intake and provider appt after 12/10/22.  Pt verbalized understanding with no further questions.   Addison Naegeli, RN  11/27/22  Patient was assessed and managed by nursing staff during this encounter. Was here for Gyn visit since she missed postpartum visit and was unexpectedly found to have positive UPT. I have reviewed the chart and agree with the documentation and plan. I have also made any necessary editorial changes.  [place], CNM 11/27/2022 11:51 AM

## 2022-12-10 ENCOUNTER — Ambulatory Visit: Payer: Commercial Managed Care - PPO

## 2022-12-10 DIAGNOSIS — Z3687 Encounter for antenatal screening for uncertain dates: Secondary | ICD-10-CM

## 2022-12-31 ENCOUNTER — Telehealth: Payer: Commercial Managed Care - PPO

## 2023-01-07 ENCOUNTER — Encounter: Payer: Commercial Managed Care - PPO | Admitting: Family Medicine

## 2023-02-04 ENCOUNTER — Encounter: Payer: Self-pay | Admitting: Family

## 2023-02-04 ENCOUNTER — Ambulatory Visit: Payer: Commercial Managed Care - PPO | Admitting: Family

## 2023-02-04 NOTE — Progress Notes (Deleted)
   Patient ID: Mallory Mcintyre, female    DOB: 03/19/2001, 22 y.o.   MRN: [id number]  No chief complaint on file.   HPI:      Back/Neck pain:     Assessment & Plan:     Subjective:    Outpatient Medications Prior to Visit  Medication Sig Dispense Refill   Acetaminophen (TYLENOL 8 HOUR PO) Take by mouth.     albuterol (VENTOLIN HFA) 108 (90 Base) MCG/ACT inhaler Inhale 2 puffs into the lungs every 6 (six) hours as needed for wheezing or shortness of breath. (Patient not taking: Reported on 08/08/2022) 8 g 0   ibuprofen (ADVIL) 600 MG tablet Take 1 tablet (600 mg total) by mouth every 6 (six) hours. (Patient not taking: Reported on 11/27/2022) 30 tablet 0   ondansetron (ZOFRAN) 4 MG tablet Take 1 tablet (4 mg total) by mouth every 8 (eight) hours as needed for nausea or vomiting. (Patient not taking: Reported on 06/10/2022) 20 tablet 0   Prenatal Vit-Fe Fumarate-FA (PREPLUS) 27-1 MG TABS Take 1 tablet by mouth daily. 30 tablet 13   No facility-administered medications prior to visit.   Past Medical History:  Diagnosis Date   Encounter for supervision of normal pregnancy, antepartum 10/20/2018    Nursing Staff Provider Office Location  [facility] Dating   LMP Language   English  Anatomy US   normal female  Flu Vaccine   Declined 10/20/2018 Genetic Screen  NIPS: low risk  AFP:  Negative   TDaP vaccine  Declined 04-26-19 Hgb A1C or  GTT   Third trimester 1hr GTT 79 Rhogam     LAB RESULTS  Feeding Plan  Breast Blood Type B/Positive/-- (03/10 1535)  Contraception  Undecided 04-26-19 Antibody Negati   History of preterm delivery 08/16/2022   Hx spontaneous PTD at 32 weeks   Scoliosis    Past Surgical History:  Procedure Laterality Date   SPINAL FUSION  08/12/2013   No Known Allergies    Objective:    Physical Exam Vitals and nursing note reviewed.  Constitutional:      Appearance: Normal appearance.  Cardiovascular:     Rate and Rhythm: Normal rate and regular rhythm.  Pulmonary:      Effort: Pulmonary effort is normal.     Breath sounds: Normal breath sounds.  Musculoskeletal:        General: Normal range of motion.  Skin:    General: Skin is warm and dry.  Neurological:     Mental Status: She is alert.  Psychiatric:        Mood and Affect: Mood normal.        Behavior: Behavior normal.    LMP 09/14/2022  Wt Readings from Last 3 Encounters:  11/27/22 180 lb 1.6 oz (81.7 kg)  08/20/22 171 lb (77.6 kg)  08/16/22 172 lb 4.8 oz (78.2 kg)       Dulce Sellar, NP

## 2023-04-23 ENCOUNTER — Other Ambulatory Visit: Payer: Self-pay

## 2023-04-23 ENCOUNTER — Emergency Department (HOSPITAL_COMMUNITY): Payer: Commercial Managed Care - PPO

## 2023-04-23 ENCOUNTER — Encounter (HOSPITAL_COMMUNITY): Payer: Self-pay | Admitting: Emergency Medicine

## 2023-04-23 ENCOUNTER — Encounter (HOSPITAL_COMMUNITY): Payer: Self-pay

## 2023-04-23 ENCOUNTER — Ambulatory Visit (HOSPITAL_COMMUNITY)
Admission: EM | Admit: 2023-04-23 | Discharge: 2023-04-23 | Disposition: A | Discharge status: Home / Self Care | Payer: Commercial Managed Care - PPO

## 2023-04-23 ENCOUNTER — Emergency Department (HOSPITAL_COMMUNITY)
Admission: EM | Admit: 2023-04-23 | Discharge: 2023-04-23 | Discharge status: Against Medical Advice | Payer: Commercial Managed Care - PPO | Attending: Emergency Medicine | Admitting: Emergency Medicine

## 2023-04-23 DIAGNOSIS — R002 Palpitations: Secondary | ICD-10-CM

## 2023-04-23 DIAGNOSIS — R0602 Shortness of breath: Secondary | ICD-10-CM | POA: Insufficient documentation

## 2023-04-23 DIAGNOSIS — R600 Localized edema: Secondary | ICD-10-CM | POA: Diagnosis not present

## 2023-04-23 DIAGNOSIS — R2242 Localized swelling, mass and lump, left lower limb: Secondary | ICD-10-CM | POA: Diagnosis present

## 2023-04-23 LAB — COMPREHENSIVE METABOLIC PANEL
ALT: 27 U/L (ref 0–44)
AST: 26 U/L (ref 15–41)
Albumin: 3.5 g/dL (ref 3.5–5.0)
Alkaline Phosphatase: 98 U/L (ref 38–126)
Anion gap: 7 (ref 5–15)
BUN: 6 mg/dL (ref 6–20)
CO2: 23 mmol/L (ref 22–32)
Calcium: 8.8 mg/dL — ABNORMAL LOW (ref 8.9–10.3)
Chloride: 106 mmol/L (ref 98–111)
Creatinine, Ser: 0.75 mg/dL (ref 0.44–1.00)
GFR, Estimated: >60 mL/min (ref >60)
Glucose, Bld: 90 mg/dL (ref 70–99)
Potassium: 3.8 mmol/L (ref 3.5–5.1)
Sodium: 136 mmol/L (ref 135–145)
Total Bilirubin: 0.4 mg/dL (ref 0.3–1.2)
Total Protein: 7.2 g/dL (ref 6.5–8.1)

## 2023-04-23 LAB — CBC WITH DIFFERENTIAL/PLATELET
Abs Immature Granulocytes: 0.13 10*3/uL — ABNORMAL HIGH (ref 0.00–0.07)
Basophils Absolute: 0.1 10*3/uL (ref 0.0–0.1)
Basophils Relative: 0 %
Eosinophils Absolute: 0.3 10*3/uL (ref 0.0–0.5)
Eosinophils Relative: 2 %
HCT: 37.1 % (ref 36.0–46.0)
Hemoglobin: 10.9 g/dL — ABNORMAL LOW (ref 12.0–15.0)
Immature Granulocytes: 1 %
Lymphocytes Relative: 26 %
Lymphs Abs: 4.1 10*3/uL — ABNORMAL HIGH (ref 0.7–4.0)
MCH: 24.2 pg — ABNORMAL LOW (ref 26.0–34.0)
MCHC: 29.4 g/dL — ABNORMAL LOW (ref 30.0–36.0)
MCV: 82.3 fL (ref 80.0–100.0)
Monocytes Absolute: 1.1 10*3/uL — ABNORMAL HIGH (ref 0.1–1.0)
Monocytes Relative: 7 %
Neutro Abs: 10.4 10*3/uL — ABNORMAL HIGH (ref 1.7–7.7)
Neutrophils Relative %: 64 %
Platelets: 441 10*3/uL — ABNORMAL HIGH (ref 150–400)
RBC: 4.51 MIL/uL (ref 3.87–5.11)
RDW: 18.3 % — ABNORMAL HIGH (ref 11.5–15.5)
WBC: 16.2 10*3/uL — ABNORMAL HIGH (ref 4.0–10.5)
nRBC: 0 % (ref 0.0–0.2)

## 2023-04-23 LAB — HCG, SERUM, QUALITATIVE: Preg, Serum: NEGATIVE

## 2023-04-23 NOTE — ED Provider Notes (Signed)
Mallory Mcintyre EMERGENCY DEPARTMENT AT [facility] Provider Note   CSN: [id number] Arrival date & time: 04/23/23  1401     History  Chief Complaint  Patient presents with   Leg Swelling   Shortness of Breath    Mallory Mcintyre is a 22 y.o. female.  HPI Patient presents with leg swelling, dyspnea.  Onset was a few days ago.  Since that time she has had mild dyspnea, and left lower extremity swelling.  Today she went to urgent care and was sent here for evaluation. Patient has pregnancy with unremarkable delivery 4 months ago, otherwise has been generally well.     Home Medications Prior to Admission medications   Medication Sig Start Date End Date Taking? Authorizing Provider  Acetaminophen (TYLENOL 8 HOUR PO) Take by mouth.    [provider]  albuterol (VENTOLIN HFA) 108 (90 Base) MCG/ACT inhaler Inhale 2 puffs into the lungs every 6 (six) hours as needed for wheezing or shortness of breath. Patient not taking: Reported on 08/08/2022 04/22/22   Sharen Counter A, CNM  ibuprofen (ADVIL) 600 MG tablet Take 1 tablet (600 mg total) by mouth every 6 (six) hours. Patient not taking: Reported on 11/27/2022 08/17/22   Reva Bores, MD  ondansetron (ZOFRAN) 4 MG tablet Take 1 tablet (4 mg total) by mouth every 8 (eight) hours as needed for nausea or vomiting. Patient not taking: Reported on 06/10/2022 03/20/22   [place] Bing, MD  Prenatal Vit-Fe Fumarate-FA (PREPLUS) 27-1 MG TABS Take 1 tablet by mouth daily. 02/10/22   Calvert Cantor, CNM      Allergies    Patient has no known allergies.    Review of Systems   Review of Systems  All other systems reviewed and are negative.   Physical Exam Updated Vital Signs BP (!) 129/90   Pulse 83   Temp 98.6 F (37 C) (Oral)   Resp 18   Ht 5\' 5"  (1.651 m)   Wt 85.3 kg   LMP 03/29/2023   SpO2 100%   BMI 31.28 kg/m  Physical Exam Vitals and nursing note reviewed.  Constitutional:      General: She is not in  acute distress.    Appearance: She is well-developed.  HENT:     Head: Normocephalic and atraumatic.  Eyes:     Conjunctiva/sclera: Conjunctivae normal.  Cardiovascular:     Rate and Rhythm: Normal rate and regular rhythm.  Pulmonary:     Effort: Pulmonary effort is normal. No respiratory distress.     Breath sounds: Normal breath sounds. No stridor.  Abdominal:     General: There is no distension.  Musculoskeletal:     Comments: Minimal asymmetric lower extremities, left greater than right  Skin:    General: Skin is warm and dry.  Neurological:     Mental Status: She is alert and oriented to person, place, and time.     Cranial Nerves: No cranial nerve deficit.  Psychiatric:        Mood and Affect: Mood normal.     ED Results / Procedures / Treatments   Labs (all labs ordered are listed, but only abnormal results are displayed) Labs Reviewed  CBC WITH DIFFERENTIAL/PLATELET - Abnormal; Notable for the following components:      Result Value   WBC 16.2 (*)    Hemoglobin 10.9 (*)    MCH 24.2 (*)    MCHC 29.4 (*)    RDW 18.3 (*)    Platelets  441 (*)    Neutro Abs 10.4 (*)    Lymphs Abs 4.1 (*)    Monocytes Absolute 1.1 (*)    Abs Immature Granulocytes 0.13 (*)    All other components within normal limits  COMPREHENSIVE METABOLIC PANEL - Abnormal; Notable for the following components:   Calcium 8.8 (*)    All other components within normal limits  HCG, SERUM, QUALITATIVE  D-DIMER, QUANTITATIVE  TSH  T4, FREE    EKG None  Radiology DG Chest 2 View  Result Date: 04/23/2023 CLINICAL DATA:  Shortness of breath and tachycardia a couple days with leg swelling. EXAM: CHEST - 2 VIEW COMPARISON:  None Available. FINDINGS: Lungs are adequately inflated and otherwise clear. Cardiomediastinal silhouette is normal. Spinal stabilization hardware over the thoracolumbar spine intact. IMPRESSION: No active cardiopulmonary disease. Electronically Signed   By: Elberta Fortis M.D.    On: 04/23/2023 16:32    Procedures Procedures    Medications Ordered in ED Medications - No data to display  ED Course/ Medical Decision Making/ A&P                                 Medical Decision Making Adult female generally well-appearing presents with dyspnea, lower extremity swelling. Patient is not hypoxic, has unremarkable vital signs, minimal risk profile for PE is PERC negative.  Initial labs reassuring, D-dimer indicated, but the patient had to leave for childcare prior to completion of her care.  Amount and/or Complexity of Data Reviewed External Data Reviewed: notes.    Details: Urgent care notes reviewed Labs: ordered. Decision-making details documented in ED Course. Radiology: ordered and independent interpretation performed. Decision-making details documented in ED Course.  Final Clinical Impression(s) / ED Diagnoses Final diagnoses:  Shortness of breath  Localized swelling of left lower extremity     Gerhard Munch, MD 04/23/23 2338

## 2023-04-23 NOTE — ED Provider Notes (Signed)
[facility]    CSN: [id number] Arrival date & time: 04/23/23  1154      History   Chief Complaint Chief Complaint  Patient presents with   Leg Swelling    HPI Mallory Mcintyre is a 22 y.o. female.   Patient presents today with 2-day history of worsening left leg swelling.  She reports her left leg is very tender above the calf and at the calf.  She also reports 2-day history of palpitations and shortness of breath worse with exertion.  She denies history of blood clots or family history of blood clots.  No history of blood clotting disorders that she knows of.  No chest pain or active shortness of breath.  Reports she has some lower leg swelling at baseline but it feels worse in the left leg for the past 2 days.    Past Medical History:  Diagnosis Date   Encounter for supervision of normal pregnancy, antepartum 10/20/2018    Nursing Staff Provider Office Location  [facility] Dating   LMP Language   English  Anatomy US   normal female  Flu Vaccine   Declined 10/20/2018 Genetic Screen  NIPS: low risk  AFP:  Negative   TDaP vaccine  Declined 04-26-19 Hgb A1C or  GTT   Third trimester 1hr GTT 79 Rhogam     LAB RESULTS  Feeding Plan  Breast Blood Type B/Positive/-- (03/10 1535)  Contraception  Undecided 04-26-19 Antibody Negati   History of preterm delivery 08/16/2022   Hx spontaneous PTD at 32 weeks   Scoliosis     Patient Active Problem List   Diagnosis Date Noted   Short interval between pregnancies affecting pregnancy, antepartum 11/27/2022   Gestational diabetes mellitus (GDM) in third trimester 08/13/2022   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   History of spinal fusion for scoliosis 09/05/2021   Multiple joint pain 09/05/2021   Alpha thalassemia silent carrier 11/03/2018   Marijuana use 10/21/2018   Scoliosis 11/26/2013    Past Surgical History:  Procedure Laterality Date   SPINAL FUSION  08/12/2013    OB History     Gravida  3   Para  2   Term  1    Preterm  1   AB  0   Living  2      SAB  0   IAB  0   Ectopic  0   Multiple  0   Live Births  2        Obstetric Comments  LMP: 03/14/2021          Home Medications    Prior to Admission medications   Medication Sig Start Date End Date Taking? Authorizing Provider  Acetaminophen (TYLENOL 8 HOUR PO) Take by mouth.    [provider]  albuterol (VENTOLIN HFA) 108 (90 Base) MCG/ACT inhaler Inhale 2 puffs into the lungs every 6 (six) hours as needed for wheezing or shortness of breath. Patient not taking: Reported on 08/08/2022 04/22/22   Sharen Counter A, CNM  ibuprofen (ADVIL) 600 MG tablet Take 1 tablet (600 mg total) by mouth every 6 (six) hours. Patient not taking: Reported on 11/27/2022 08/17/22   Reva Bores, MD  ondansetron (ZOFRAN) 4 MG tablet Take 1 tablet (4 mg total) by mouth every 8 (eight) hours as needed for nausea or vomiting. Patient not taking: Reported on 06/10/2022 03/20/22   Rhodell Bing, MD  Prenatal Vit-Fe Fumarate-FA (PREPLUS) 27-1 MG TABS Take 1 tablet by mouth  daily. 02/10/22   Calvert Cantor, CNM    Family History Family History  Problem Relation Age of Onset   Heart disease Maternal Grandmother    Drug abuse Maternal Grandfather    COPD Maternal Grandfather    Alcohol abuse Maternal Grandfather    Diabetes Other    Asthma Other    Hypertension Other     Social History Social History   Tobacco Use   Smoking status: Former    Types: Cigars   Smokeless tobacco: Never   Tobacco comments:    2-3 Black n mild daily   Vaping Use   Vaping status: Never Used  Substance Use Topics   Alcohol use: Not Currently   Drug use: Not Currently    Types: Marijuana    Comment: last smoked June 2020     Allergies   Patient has no known allergies.   Review of Systems Review of Systems Per HPI  Physical Exam Triage Vital Signs ED Triage Vitals  Encounter Vitals Group     BP 04/23/23 1316 (!) 132/90     Systolic  BP Percentile --      Diastolic BP Percentile --      Pulse Rate 04/23/23 1316 89     Resp 04/23/23 1316 17     Temp 04/23/23 1316 98.1 F (36.7 C)     Temp Source 04/23/23 1316 Oral     SpO2 04/23/23 1316 98 %     Weight --      Height --      Head Circumference --      Peak Flow --      Pain Score 04/23/23 1313 8     Pain Loc --      Pain Education --      Exclude from Growth Chart --    No data found.  Updated Vital Signs BP (!) 132/90 (BP Location: Left Arm)   Pulse 89   Temp 98.1 F (36.7 C) (Oral)   Resp 17   LMP 03/29/2023   SpO2 98%   Breastfeeding No   Visual Acuity Right Eye Distance:   Left Eye Distance:   Bilateral Distance:    Right Eye Near:   Left Eye Near:    Bilateral Near:     Physical Exam Vitals and nursing note reviewed.  Constitutional:      General: She is not in acute distress.    Appearance: Normal appearance. She is not toxic-appearing.  HENT:     Head: Normocephalic.     Mouth/Throat:     Mouth: Mucous membranes are moist.     Pharynx: Oropharynx is clear.  Cardiovascular:     Rate and Rhythm: Normal rate and regular rhythm.  Pulmonary:     Effort: Pulmonary effort is normal. No respiratory distress.  Musculoskeletal:     Comments: Bilateral pitting edema noted to bilateral lower extremities approximately +1  Calf circumference 14.5 inches bilaterally  No warmth, red streaking, however left lower extremity exquisitely tender to touch   Skin:    General: Skin is warm and dry.     Capillary Refill: Capillary refill takes less than 2 seconds.     Coloration: Skin is not jaundiced or pale.     Findings: No erythema.  Neurological:     Mental Status: She is alert and oriented to person, place, and time.      UC Treatments / Results  Labs (all labs ordered are listed, but only abnormal results  are displayed) Labs Reviewed - No data to display  EKG   Radiology No results found.  Procedures Procedures (including  critical care time)  Medications Ordered in UC Medications - No data to display  Initial Impression / Assessment and Plan / UC Course  I have reviewed the triage vital signs and the nursing notes.  Pertinent labs & imaging results that were available during my care of the patient were reviewed by me and considered in my medical decision making (see chart for details).   Patient is well-appearing, normotensive, afebrile, not tachycardic, not tachypneic, oxygenating well on room air.    1. Leg edema 2. Shortness of breath 3. Palpitations I do not appreciate edema worse on the left lower extremity, however given shortness of breath and palpitations worsened over the past couple of days, recommended further evaluation and management emergency room Patient is safe to transport via private vehicle at this time and is agreeable to plan  The patient was given the opportunity to ask questions.  All questions answered to their satisfaction.  The patient is in agreement to this plan.    Final Clinical Impressions(s) / UC Diagnoses   Final diagnoses:  Leg edema  Shortness of breath  Palpitations     Discharge Instructions      Please go directly to the emergency room for further evaluation and management of your symptoms   ED Prescriptions   None    PDMP not reviewed this encounter.   Valentino Nose, NP 04/23/23 1504

## 2023-04-23 NOTE — ED Notes (Signed)
Patient no longer in room

## 2023-04-23 NOTE — Discharge Instructions (Signed)
Please go directly to the emergency room for further evaluation and management of your symptoms. 

## 2023-04-23 NOTE — ED Notes (Signed)
Pt has trace swelling of lower extrimitites bilat. Pt has 2+ pedal pulse bilat.

## 2023-04-23 NOTE — ED Triage Notes (Signed)
Pt arrives via POV. Pt reports sob and feels like her heart has been racing for the past couple of days. Pt has also noticed swelling in both legs.

## 2023-04-23 NOTE — ED Notes (Signed)
Patient is being discharged from the Urgent Care and sent to the Emergency Department via POV. Per Cathlean Marseilles, NP, patient is in need of higher level of care due to leg swelling. Patient is aware and verbalizes understanding of plan of care.  Vitals:   04/23/23 1316  BP: (!) 132/90  Pulse: 89  Resp: 17  Temp: 98.1 F (36.7 C)  SpO2: 98%

## 2023-04-23 NOTE — ED Notes (Signed)
Patient states she needs to go home . She has an 66 month old at home to take care of. Patient states she can't stay for the added blood work. MD notified.

## 2023-04-23 NOTE — ED Triage Notes (Signed)
Pt c/o bilateral lower leg swelling for weeks. Reports in past couple days left leg swelling and pain has increased.

## 2023-04-24 ENCOUNTER — Other Ambulatory Visit: Payer: Self-pay

## 2023-04-24 ENCOUNTER — Emergency Department (HOSPITAL_COMMUNITY)
Admission: EM | Admit: 2023-04-24 | Discharge: 2023-04-24 | Disposition: A | Discharge status: Home / Self Care | Payer: Commercial Managed Care - PPO | Attending: Emergency Medicine | Admitting: Emergency Medicine

## 2023-04-24 ENCOUNTER — Encounter (HOSPITAL_COMMUNITY): Payer: Self-pay

## 2023-04-24 ENCOUNTER — Emergency Department (HOSPITAL_COMMUNITY): Payer: Commercial Managed Care - PPO

## 2023-04-24 ENCOUNTER — Emergency Department (HOSPITAL_BASED_OUTPATIENT_CLINIC_OR_DEPARTMENT_OTHER): Payer: Commercial Managed Care - PPO

## 2023-04-24 DIAGNOSIS — M7989 Other specified soft tissue disorders: Secondary | ICD-10-CM | POA: Diagnosis present

## 2023-04-24 DIAGNOSIS — R0602 Shortness of breath: Secondary | ICD-10-CM | POA: Insufficient documentation

## 2023-04-24 DIAGNOSIS — Z20822 Contact with and (suspected) exposure to covid-19: Secondary | ICD-10-CM | POA: Diagnosis not present

## 2023-04-24 LAB — CBC
HCT: 39.3 % (ref 36.0–46.0)
Hemoglobin: 11.7 g/dL — ABNORMAL LOW (ref 12.0–15.0)
MCH: 24.9 pg — ABNORMAL LOW (ref 26.0–34.0)
MCHC: 29.8 g/dL — ABNORMAL LOW (ref 30.0–36.0)
MCV: 83.6 fL (ref 80.0–100.0)
Platelets: 489 10*3/uL — ABNORMAL HIGH (ref 150–400)
RBC: 4.7 MIL/uL (ref 3.87–5.11)
RDW: 18.1 % — ABNORMAL HIGH (ref 11.5–15.5)
WBC: 13.4 10*3/uL — ABNORMAL HIGH (ref 4.0–10.5)
nRBC: 0 % (ref 0.0–0.2)

## 2023-04-24 LAB — COMPREHENSIVE METABOLIC PANEL
ALT: 31 U/L (ref 0–44)
AST: 33 U/L (ref 15–41)
Albumin: 3.9 g/dL (ref 3.5–5.0)
Alkaline Phosphatase: 110 U/L (ref 38–126)
Anion gap: 8 (ref 5–15)
BUN: 7 mg/dL (ref 6–20)
CO2: 23 mmol/L (ref 22–32)
Calcium: 8.6 mg/dL — ABNORMAL LOW (ref 8.9–10.3)
Chloride: 104 mmol/L (ref 98–111)
Creatinine, Ser: 0.72 mg/dL (ref 0.44–1.00)
GFR, Estimated: >60 mL/min (ref >60)
Glucose, Bld: 96 mg/dL (ref 70–99)
Potassium: 3.4 mmol/L — ABNORMAL LOW (ref 3.5–5.1)
Sodium: 135 mmol/L (ref 135–145)
Total Bilirubin: 0.6 mg/dL (ref 0.3–1.2)
Total Protein: 8 g/dL (ref 6.5–8.1)

## 2023-04-24 LAB — RESP PANEL BY RT-PCR (RSV, FLU A&B, COVID)  RVPGX2
Influenza A by PCR: NEGATIVE
Influenza B by PCR: NEGATIVE
Resp Syncytial Virus by PCR: NEGATIVE
SARS Coronavirus 2 by RT PCR: NEGATIVE

## 2023-04-24 LAB — HCG, SERUM, QUALITATIVE: Preg, Serum: NEGATIVE

## 2023-04-24 LAB — BRAIN NATRIURETIC PEPTIDE: B Natriuretic Peptide: 17.5 pg/mL (ref 0.0–100.0)

## 2023-04-24 LAB — D-DIMER, QUANTITATIVE: D-Dimer, Quant: 1.38 ug{FEU}/mL — ABNORMAL HIGH (ref 0.00–0.50)

## 2023-04-24 MED ORDER — IOHEXOL 350 MG/ML SOLN
75.0000 mL | Freq: Once | INTRAVENOUS | Status: AC | PRN
  Administered 2023-04-24: 75 mL via INTRAVENOUS

## 2023-04-24 NOTE — Progress Notes (Signed)
Lower extremity venous left study completed.  Preliminary results relayed to [place], [place].   See CV Proc for preliminary results report.   Darlin Coco, RDMS, RVT

## 2023-04-24 NOTE — ED Notes (Signed)
Called lab to ask for update on covid swab, they said it has 30 minutes to run.

## 2023-04-24 NOTE — ED Triage Notes (Signed)
Pt seen yesterday at [facility] and left bc of wait. Pt has heart racing, palpitations and SOB with palpitations. Denies chest pain. This started 3 days ago. Denies cough fever chills nausea vomiting. Endorses diarrhea. Pt also c/o BL leg swelling for 5 days.

## 2023-04-24 NOTE — Discharge Instructions (Signed)
It was a pleasure taking part in your care today.  As we discussed, your workup is reassuring.  Please follow-up with your PCP for further management and reevaluation.  Please purchase compression stockings to help with lower extremity swelling.  Please return to the ED with any new symptoms such as chest pain, nausea, vomiting, increased shortness of breath.

## 2023-04-24 NOTE — ED Provider Notes (Signed)
[facility] EM[facility]GENCY DEPARTMENT AT [facility] Provider Note   CSN: [id number] Arrival date & time: 04/24/23  1056     History  Chief Complaint  Patient presents with   Palpitations   Leg Swelling    Mallory Mcintyre is a 22 y.o. female with medical history of scoliosis.  Patient presents to ED for evaluation.  Patient reports that for the last 3 days she has a progressively worsening bilateral lower extremity swelling, shortness of breath with exertion, palpitations.  The patient denies chest pain, nausea, vomiting, lightheadedness, dizziness, weakness.  She reports that she was seen at urgent care and redirected to ED yesterday for further workup.  She reports that she had to leave prior to workup being complete due to childcare issues while she was at [facility] ED day prior.  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  She feels as if she is having palpitations.  She denies a history of cardiac disease or ever seeing cardiology.  She denies exogenous hormone use.  Denies fevers, bodyaches or chills.   Palpitations Associated symptoms: shortness of breath        Home Medications Prior to Admission medications   Medication Sig Start Date End Date Taking? Authorizing Provider  Acetaminophen (TYLENOL 8 HOUR PO) Take 1 tablet by mouth daily as needed (pain).   Yes [provider]  ibuprofen (ADVIL) 600 MG tablet Take 1 tablet (600 mg total) by mouth every 6 (six) hours. 08/17/22  Yes Reva Bores, MD      Allergies    Patient has no known allergies.    Review of Systems   Review of Systems  Respiratory:  Positive for shortness of breath.   Cardiovascular:  Positive for palpitations and leg swelling.  All other systems reviewed and are negative.   Physical Exam Updated Vital Signs BP 119/81 (BP Location: Left Arm)   Pulse 61   Temp 99 F (37.2 C) (Oral)   Resp 15   Ht 5\' 5"  (1.651 m)   Wt  85.3 kg   LMP 03/29/2023   SpO2 100%   BMI 31.28 kg/m  Physical Exam Vitals and nursing note reviewed.  Constitutional:      General: She is not in acute distress.    Appearance: Normal appearance. She is not ill-appearing, toxic-appearing or diaphoretic.  HENT:     Head: Normocephalic and atraumatic.     Nose: Nose normal.     Mouth/Throat:     Mouth: Mucous membranes are moist.     Pharynx: Oropharynx is clear.  Eyes:     Extraocular Movements: Extraocular movements intact.     Conjunctiva/sclera: Conjunctivae normal.     Pupils: Pupils are equal, round, and reactive to light.  Cardiovascular:     Rate and Rhythm: Normal rate and regular rhythm.  Pulmonary:     Effort: Pulmonary effort is normal.     Breath sounds: Normal breath sounds. No wheezing.  Abdominal:     General: Abdomen is flat. Bowel sounds are normal.     Palpations: Abdomen is soft.     Tenderness: There is no abdominal tenderness.  Musculoskeletal:     Cervical back: Normal range of motion and neck supple. No tenderness.     Comments: Minimal bilateral lower extremity edema, nonpitting  Skin:    General: Skin is warm and dry.     Capillary Refill: Capillary refill  takes less than 2 seconds.  Neurological:     General: No focal deficit present.     Mental Status: She is alert and oriented to person, place, and time.     GCS: GCS eye subscore is 4. GCS verbal subscore is 5. GCS motor subscore is 6.     Cranial Nerves: Cranial nerves 2-12 are intact. No cranial nerve deficit.     Sensory: Sensation is intact. No sensory deficit.     Motor: Motor function is intact. No weakness.     ED Results / Procedures / Treatments   Labs (all labs ordered are listed, but only abnormal results are displayed) Labs Reviewed  COMPREHENSIVE METABOLIC PANEL - Abnormal; Notable for the following components:      Result Value   Potassium 3.4 (*)    Calcium 8.6 (*)    All other components within normal limits  CBC -  Abnormal; Notable for the following components:   WBC 13.4 (*)    Hemoglobin 11.7 (*)    MCH 24.9 (*)    MCHC 29.8 (*)    RDW 18.1 (*)    Platelets 489 (*)    All other components within normal limits  D-DIMER, QUANTITATIVE - Abnormal; Notable for the following components:   D-Dimer, Quant 1.38 (*)    All other components within normal limits  RESP PANEL BY RT-PCR (RSV, FLU A&B, COVID)  [id number]  HCG, SERUM, QUALITATIVE  BRAIN NATRIURETIC PEPTIDE    EKG None  Radiology CT Angio Chest PE W and/or Wo Contrast  Result Date: 04/24/2023 CLINICAL DATA:  Tachycardia short of breath palpitations EXAM: CT ANGIOGRAPHY CHEST WITH CONTRAST TECHNIQUE: Multidetector CT imaging of the chest was performed using the standard protocol during bolus administration of intravenous contrast. Multiplanar CT image reconstructions and MIPs were obtained to evaluate the vascular anatomy. RADIATION DOSE REDUCTION: This exam was performed according to the departmental dose-optimization program which includes automated exposure control, adjustment of the mA and/or kV according to patient size and/or use of iterative reconstruction technique. CONTRAST:  75mL OMNIPAQUE IOHEXOL 350 MG/ML SOLN COMPARISON:  Chest x-ray 04/24/2023, radiograph 03/18/2021 FINDINGS: Cardiovascular: Satisfactory opacification of the pulmonary arteries to the segmental level. No evidence of pulmonary embolism. Normal heart size. No pericardial effusion. Nonaneurysmal aorta. Mediastinum/Nodes: Midline trachea. No thyroid mass. No suspicious lymph nodes. Esophagus within normal limits. Lungs/Pleura: Lungs are clear. No pleural effusion or pneumothorax. Upper Abdomen: No acute abnormality. Musculoskeletal: Extensive posterior spinal rods extending from T5 through the visualized upper lumbar spine. Fixating screw on the left side of T7 extends beyond the left anterior vertebral margin with the tip overlying the midthoracic aorta on series 5, image 65.  Aortic contour at this level grossly normal allowing for significant artifact. Review of the MIP images confirms the above findings. IMPRESSION: 1. Negative for acute pulmonary embolus or aortic dissection. 2. Extensive posterior spinal rods extending from T5 through the visualized upper lumbar spine. Fixating screw on the left side of T7 extends beyond the left anterior vertebral margin with the tip overlying the midthoracic aorta. Aortic contour at this level grossly normal allowing for significant artifact and there is no gross evidence for acute posterior mediastinal finding on this exam. Electronically Signed   By: Jasmine Pang M.D.   On: 04/24/2023 18:29   VAS [place] LOWER EXTREMITY VENOUS (DVT) (7a-7p)  Result Date: 04/24/2023  Lower Venous DVT Study Patient Name:  CARINA MEINHOLD  Date of Exam:   04/24/2023 Medical Rec #: [id number]  Accession #:    [id number] Date of Birth: 2000-09-13   Patient Gender: F Patient Age:   62 years Exam Location:  [facility] Procedure:      VAS [place] LOWER EXTREMITY VENOUS (DVT) Referring Phys: Cristal Deer Geovanny Sartin --------------------------------------------------------------------------------  Indications: Left leg swelling, tenderness.  Comparison Study: No prior studies. Performing Technologist: Jean Rosenthal RDMS, RVT  Examination Guidelines: A complete evaluation includes B-mode imaging, spectral Doppler, color Doppler, and power Doppler as needed of all accessible portions of each vessel. Bilateral testing is considered an integral part of a complete examination. Limited examinations for reoccurring indications may be performed as noted. The reflux portion of the exam is performed with the patient in reverse Trendelenburg.  +-----+---------------+---------+-----------+----------+--------------+ RIGHTCompressibilityPhasicitySpontaneityPropertiesThrombus Aging +-----+---------------+---------+-----------+----------+--------------+ CFV  Full           Yes       Yes                                 +-----+---------------+---------+-----------+----------+--------------+   +---------+---------------+---------+-----------+----------+--------------+ LEFT     CompressibilityPhasicitySpontaneityPropertiesThrombus Aging +---------+---------------+---------+-----------+----------+--------------+ CFV      Full           Yes      Yes                                 +---------+---------------+---------+-----------+----------+--------------+ SFJ      Full                                                        +---------+---------------+---------+-----------+----------+--------------+ FV Prox  Full                                                        +---------+---------------+---------+-----------+----------+--------------+ FV Mid   Full                                                        +---------+---------------+---------+-----------+----------+--------------+ FV DistalFull           Yes      Yes                                 +---------+---------------+---------+-----------+----------+--------------+ PFV      Full                                                        +---------+---------------+---------+-----------+----------+--------------+ POP      Full           Yes      Yes                                 +---------+---------------+---------+-----------+----------+--------------+  PTV      Full                                                        +---------+---------------+---------+-----------+----------+--------------+ PERO     Full                                                        +---------+---------------+---------+-----------+----------+--------------+     Summary: RIGHT: - No evidence of common femoral vein obstruction.  LEFT: - There is no evidence of deep vein thrombosis in the lower extremity.  - No cystic structure found in the popliteal fossa.  *See table(s) above for measurements  and observations.    Preliminary    DG Chest Port 1 View  Result Date: 04/24/2023 CLINICAL DATA:  Shortness of breath and palpitations EXAM: PORTABLE CHEST 1 VIEW COMPARISON:  X-ray 04/23/2023 FINDINGS: The heart size and mediastinal contours are within normal limits. No consolidation, pneumothorax or effusion. No edema. Extensive fixation hardware along the thoracolumbar spine IMPRESSION: No acute cardiopulmonary disease. Electronically Signed   By: Karen Kays M.D.   On: 04/24/2023 13:14   DG Chest 2 View  Result Date: 04/23/2023 CLINICAL DATA:  Shortness of breath and tachycardia a couple days with leg swelling. EXAM: CHEST - 2 VIEW COMPARISON:  None Available. FINDINGS: Lungs are adequately inflated and otherwise clear. Cardiomediastinal silhouette is normal. Spinal stabilization hardware over the thoracolumbar spine intact. IMPRESSION: No active cardiopulmonary disease. Electronically Signed   By: Elberta Fortis M.D.   On: 04/23/2023 16:32    Procedures Procedures   Medications Ordered in ED Medications  iohexol (OMNIPAQUE) 350 MG/ML injection 75 mL (75 mLs Intravenous Contrast Given 04/24/23 1623)    ED Course/ Medical Decision Making/ A&P Clinical Course as of 04/24/23 1844  Thu Apr 24, 2023  1409 LMP 8/17 [CG]    Clinical Course User Index [CG] Al Decant, PA-C   Medical Decision Making Amount and/or Complexity of Data Reviewed Labs: ordered. Radiology: ordered.  Risk Prescription drug management.   22 year old female presents to ED for evaluation.  Please see HPI for further details.  On examination the patient is afebrile, nontachycardic.  Her lung sounds are clear bilaterally, she is not hypoxic.  Abdomen is soft and compressible throughout.  Neurological examination at baseline.  Patient has minimal bilateral lower extremity edema to her lower extremities, nonpitting.  Overall nontoxic in appearance.  Reassuring vital signs.  Patient CBC with leukocytosis  of 13.4, baseline hemoglobin 11.7.  Metabolic panel shows sodium 135 from potassium 3.4, no elevated LFTs, no elevated creatinine, anion gap 8.  D-dimer elevated at 1.38 so we will proceed with CTA.  BNP not elevated at 17.5.  Viral panel negative for all.  Chest x-ray unremarkable.  EKG is nonischemic.  CTA unremarkable without evidence of PE.  At this time, patient advised to purchase compression stockings for minimal lower extremity edema.  She was advised to follow-up with PCP and purchase compression stockings in the interim.  Advised to return to the ED with any new or worsening signs or symptoms and she voiced understanding.   Final Clinical Impression(s) / ED Diagnoses Final  diagnoses:  Leg swelling  Shortness of breath    Rx / DC Orders ED Discharge Orders     None         Al Decant, PA-C 04/24/23 1845    Rozelle Logan, DO 04/25/23 1657

## 2023-04-24 NOTE — ED Notes (Signed)
Blue top sent to lab and on hold

## 2023-04-28 NOTE — Progress Notes (Unsigned)
Patient ID: Mallory Mcintyre, female    DOB: 10-May-2001, 22 y.o.   MRN: [id number]  No chief complaint on file.   HPI: ED f/u:  seen 9/11 (had to leave) for leg swelling & SOB, returned 9/12 and CTA done & DVT U/S both neg, d-dimer slightly high. CBC w/elevated WBC & +neutrophils on 9/11, rechecked on 9/12, down to 13 from 16 w/o tx.   Assessment & Plan:  There are no diagnoses linked to this encounter.  Subjective:    Outpatient Medications Prior to Visit  Medication Sig Dispense Refill   Acetaminophen (TYLENOL 8 HOUR PO) Take 1 tablet by mouth daily as needed (pain).     ibuprofen (ADVIL) 600 MG tablet Take 1 tablet (600 mg total) by mouth every 6 (six) hours. 30 tablet 0   No facility-administered medications prior to visit.   Past Medical History:  Diagnosis Date   Encounter for supervision of normal pregnancy, antepartum 10/20/2018    Nursing Staff Provider Office Location  [facility] Dating   LMP Language   English  Anatomy US   normal female  Flu Vaccine   Declined 10/20/2018 Genetic Screen  NIPS: low risk  AFP:  Negative   TDaP vaccine  Declined 04-26-19 Hgb A1C or  GTT   Third trimester 1hr GTT 79 Rhogam     LAB RESULTS  Feeding Plan  Breast Blood Type B/Positive/-- (03/10 1535)  Contraception  Undecided 04-26-19 Antibody Negati   History of preterm delivery 08/16/2022   Hx spontaneous PTD at 32 weeks   Scoliosis    Past Surgical History:  Procedure Laterality Date   SPINAL FUSION  08/12/2013   No Known Allergies    Objective:    Physical Exam Vitals and nursing note reviewed.  Constitutional:      Appearance: Normal appearance.  Cardiovascular:     Rate and Rhythm: Normal rate and regular rhythm.  Pulmonary:     Effort: Pulmonary effort is normal.     Breath sounds: Normal breath sounds.  Musculoskeletal:        General: Normal range of motion.  Skin:    General: Skin is warm and dry.  Neurological:     Mental Status: She is alert.  Psychiatric:        Mood and  Affect: Mood normal.        Behavior: Behavior normal.    LMP 03/29/2023  Wt Readings from Last 3 Encounters:  04/24/23 188 lb (85.3 kg)  04/23/23 188 lb (85.3 kg)  11/27/22 180 lb 1.6 oz (81.7 kg)       Dulce Sellar, NP

## 2023-04-29 ENCOUNTER — Encounter: Payer: Self-pay | Admitting: Family

## 2023-04-29 ENCOUNTER — Ambulatory Visit (INDEPENDENT_AMBULATORY_CARE_PROVIDER_SITE_OTHER): Payer: Commercial Managed Care - PPO | Admitting: Family

## 2023-04-29 VITALS — BP 154/94 | HR 70 | Temp 98.0°F | Ht 65.0 in | Wt 188.8 lb

## 2023-04-29 DIAGNOSIS — I10 Essential (primary) hypertension: Secondary | ICD-10-CM

## 2023-04-29 DIAGNOSIS — R600 Localized edema: Secondary | ICD-10-CM | POA: Diagnosis not present

## 2023-04-29 DIAGNOSIS — R7989 Other specified abnormal findings of blood chemistry: Secondary | ICD-10-CM | POA: Diagnosis not present

## 2023-04-29 HISTORY — DX: Localized edema: R60.0

## 2023-04-29 LAB — CBC WITH DIFFERENTIAL/PLATELET
Basophils Absolute: 0 10*3/uL (ref 0.0–0.1)
Basophils Relative: 0.4 % (ref 0.0–3.0)
Eosinophils Absolute: 0.2 10*3/uL (ref 0.0–0.7)
Eosinophils Relative: 1.6 % (ref 0.0–5.0)
HCT: 36 % (ref 36.0–46.0)
Hemoglobin: 10.8 g/dL — ABNORMAL LOW (ref 12.0–15.0)
Lymphocytes Relative: 24.9 % (ref 12.0–46.0)
Lymphs Abs: 3.4 10*3/uL (ref 0.7–4.0)
MCHC: 30.1 g/dL (ref 30.0–36.0)
MCV: 81.6 fl (ref 78.0–100.0)
Monocytes Absolute: 0.8 10*3/uL (ref 0.1–1.0)
Monocytes Relative: 5.8 % (ref 3.0–12.0)
Neutro Abs: 9.2 10*3/uL — ABNORMAL HIGH (ref 1.4–7.7)
Neutrophils Relative %: 67.3 % (ref 43.0–77.0)
Platelets: 456 10*3/uL — ABNORMAL HIGH (ref 150.0–400.0)
RBC: 4.41 Mil/uL (ref 3.87–5.11)
RDW: 19.2 % — ABNORMAL HIGH (ref 11.5–15.5)
WBC: 13.6 10*3/uL — ABNORMAL HIGH (ref 4.0–10.5)

## 2023-04-29 LAB — POCT URINALYSIS DIPSTICK
Bilirubin, UA: NEGATIVE
Blood, UA: POSITIVE — AB
Glucose, UA: NEGATIVE
Ketones, UA: NEGATIVE
Leukocytes, UA: NEGATIVE
Nitrite, UA: NEGATIVE
Protein, UA: POSITIVE — AB
Spec Grav, UA: 1.015 (ref 1.010–1.025)
Urobilinogen, UA: 0.2 U/dL
pH, UA: 6.5 (ref 5.0–8.0)

## 2023-04-29 MED ORDER — HYDROCHLOROTHIAZIDE 12.5 MG PO TABS
12.5000 mg | ORAL_TABLET | Freq: Every day | ORAL | 2 refills | Status: DC
Start: 2023-04-29 — End: 2023-12-26

## 2023-04-29 NOTE — Assessment & Plan Note (Signed)
NEW  none noted today, pt states it worsens during day and swell to twice their size by end of day, pt drives a bus all day, can take short breaks & for lunch seen in ER recently & DVT/PE ruled out starting HCTZ 12.5mg  daily for BP, which should help with swelling also Advised on fluid intake, low sodium diet, and wearing mild compression socks during day f/u 1 month

## 2023-04-29 NOTE — Assessment & Plan Note (Signed)
New mildly high readings in ER recently today 154/94, reports occ headache, occurring more often lately, having leg swelling, denies dizziness  wt gain of 17 lbs since beginning of year after birth of baby starting HCTZ 12.5mg  qam, advised on use & SE advised on 2L water intake daily, low sodium diet, exercise f/u 1 mo

## 2023-05-02 ENCOUNTER — Encounter (HOSPITAL_COMMUNITY): Payer: Self-pay | Admitting: *Deleted

## 2023-05-02 ENCOUNTER — Other Ambulatory Visit: Payer: Self-pay

## 2023-05-02 ENCOUNTER — Emergency Department (HOSPITAL_COMMUNITY)
Admission: EM | Admit: 2023-05-02 | Discharge: 2023-05-03 | Disposition: A | Discharge status: Home / Self Care | Payer: Commercial Managed Care - PPO | Attending: Emergency Medicine | Admitting: Emergency Medicine

## 2023-05-02 DIAGNOSIS — R42 Dizziness and giddiness: Secondary | ICD-10-CM | POA: Insufficient documentation

## 2023-05-02 DIAGNOSIS — M62838 Other muscle spasm: Secondary | ICD-10-CM

## 2023-05-02 DIAGNOSIS — E876 Hypokalemia: Secondary | ICD-10-CM | POA: Diagnosis not present

## 2023-05-02 LAB — CBC WITH DIFFERENTIAL/PLATELET
Abs Immature Granulocytes: 0.46 10*3/uL — ABNORMAL HIGH (ref 0.00–0.07)
Basophils Absolute: 0.1 10*3/uL (ref 0.0–0.1)
Basophils Relative: 0 %
Eosinophils Absolute: 0.2 10*3/uL (ref 0.0–0.5)
Eosinophils Relative: 1 %
HCT: 43.2 % (ref 36.0–46.0)
Hemoglobin: 12.7 g/dL (ref 12.0–15.0)
Immature Granulocytes: 2 %
Lymphocytes Relative: 26 %
Lymphs Abs: 5 10*3/uL — ABNORMAL HIGH (ref 0.7–4.0)
MCH: 24.8 pg — ABNORMAL LOW (ref 26.0–34.0)
MCHC: 29.4 g/dL — ABNORMAL LOW (ref 30.0–36.0)
MCV: 84.2 fL (ref 80.0–100.0)
Monocytes Absolute: 1.1 10*3/uL — ABNORMAL HIGH (ref 0.1–1.0)
Monocytes Relative: 6 %
Neutro Abs: 12.2 10*3/uL — ABNORMAL HIGH (ref 1.7–7.7)
Neutrophils Relative %: 65 %
Platelets: 370 10*3/uL (ref 150–400)
RBC: 5.13 MIL/uL — ABNORMAL HIGH (ref 3.87–5.11)
RDW: 18.8 % — ABNORMAL HIGH (ref 11.5–15.5)
WBC: 19 10*3/uL — ABNORMAL HIGH (ref 4.0–10.5)
nRBC: 0 % (ref 0.0–0.2)

## 2023-05-02 LAB — BASIC METABOLIC PANEL
Anion gap: 11 (ref 5–15)
BUN: 8 mg/dL (ref 6–20)
CO2: 20 mmol/L — ABNORMAL LOW (ref 22–32)
Calcium: 7.8 mg/dL — ABNORMAL LOW (ref 8.9–10.3)
Chloride: 101 mmol/L (ref 98–111)
Creatinine, Ser: 0.96 mg/dL (ref 0.44–1.00)
GFR, Estimated: >60 mL/min (ref >60)
Glucose, Bld: 155 mg/dL — ABNORMAL HIGH (ref 70–99)
Potassium: 2.5 mmol/L — CL (ref 3.5–5.1)
Sodium: 132 mmol/L — ABNORMAL LOW (ref 135–145)

## 2023-05-02 LAB — MAGNESIUM: Magnesium: 2.4 mg/dL (ref 1.7–2.4)

## 2023-05-02 MED ORDER — POTASSIUM CHLORIDE CRYS ER 20 MEQ PO TBCR
40.0000 meq | EXTENDED_RELEASE_TABLET | Freq: Once | ORAL | Status: AC
  Administered 2023-05-02: 40 meq via ORAL
  Filled 2023-05-02: qty 2

## 2023-05-02 MED ORDER — POTASSIUM CHLORIDE 10 MEQ/100ML IV SOLN
10.0000 meq | INTRAVENOUS | Status: AC
  Administered 2023-05-02 (×2): 10 meq via INTRAVENOUS
  Filled 2023-05-02 (×2): qty 100

## 2023-05-02 MED ORDER — LACTATED RINGERS IV BOLUS
1000.0000 mL | Freq: Once | INTRAVENOUS | Status: AC
  Administered 2023-05-02: 1000 mL via INTRAVENOUS

## 2023-05-02 MED ORDER — CYCLOBENZAPRINE HCL 10 MG PO TABS
5.0000 mg | ORAL_TABLET | Freq: Once | ORAL | Status: AC
  Administered 2023-05-02: 5 mg via ORAL
  Filled 2023-05-02: qty 1

## 2023-05-02 MED ORDER — POTASSIUM CHLORIDE CRYS ER 20 MEQ PO TBCR: 20.0000 meq | EXTENDED_RELEASE_TABLET | Freq: Two times a day (BID) | ORAL | 0 refills | Status: DC

## 2023-05-02 MED ORDER — MAGNESIUM SULFATE 2 GM/50ML IV SOLN
2.0000 g | Freq: Once | INTRAVENOUS | Status: AC
  Administered 2023-05-02: 2 g via INTRAVENOUS
  Filled 2023-05-02: qty 50

## 2023-05-02 NOTE — Discharge Instructions (Addendum)
Overall I suspect your symptoms are from low potassium from your blood draw today.  I have prescribed you potassium to take.  Recommend eating some bananas and Gatorade with potassium in it.  If you continue to have bad spasms, lightheaded episodes I recommend that you come back sooner.  Otherwise I think he should have your potassium rechecked next week with your primary care doctor.  I would hold on taking your hydrochlorothiazide at this time.  Talk to your primary care doctor about restarting this medicine.

## 2023-05-02 NOTE — ED Provider Notes (Signed)
Care assumed from Dr. Lockie Mola, patient with muscle spasms and marked hypokalemia, getting intravenous and oral potassium.  Plan is for discharge if spasms have improved following potassium administration.  1:19 AM Patient is feeling much better, able to ambulate without spasms.  I am discharging her.  Prescription for potassium had been sent in.  She will need to work with her primary care provider as she will likely need ongoing potassium supplementation if she continues to take a diuretic.   Dione Booze, MD 05/03/23 [phone]

## 2023-05-02 NOTE — ED Provider Notes (Addendum)
[place] EMERGENCY DEPARTMENT AT [facility] Provider Note   CSN: [id number] Arrival date & time: 05/02/23  2024     History  Chief Complaint  Patient presents with   Dizziness    Mallory Mcintyre is a 22 y.o. female.  Patient here after she got lightheaded after donating plasma.  She just just donated plasma.  She got some IV fluids with EMS.  Blood pressure now normal after being initially low.  She is little bit tachycardic.  Blood sugar was 200.  She stood up and got lightheaded and dizzy threw up once and had some spasms through out her body but those symptoms are now improved except in her legs.  She has no major medical problems.  Nothing makes it worse or better.  She usually donates plasma about once a month.  Denies any fevers or chills.  Has been in her normal state of health otherwise.  The history is provided by the patient.       Home Medications Prior to Admission medications   Medication Sig Start Date End Date Taking? Authorizing Provider  hydrochlorothiazide (HYDRODIURIL) 12.5 MG tablet Take 1 tablet (12.5 mg total) by mouth daily. 04/29/23  Yes Hudnell, Judeth Cornfield, NP  potassium chloride SA (KLOR-CON M) 20 MEQ tablet Take 1 tablet (20 mEq total) by mouth 2 (two) times daily for 5 days. 05/02/23 05/07/23 Yes Jcion Buddenhagen, DO      Allergies    Patient has no known allergies.    Review of Systems   Review of Systems  Physical Exam Updated Vital Signs BP 102/85   Pulse (!) 111   Temp (!) 97.4 F (36.3 C) (Oral)   Resp 18   LMP 04/26/2023 (Exact Date)   SpO2 100%  Physical Exam Vitals and nursing note reviewed.  Constitutional:      General: She is not in acute distress.    Appearance: She is well-developed. She is not ill-appearing.  HENT:     Head: Normocephalic and atraumatic.     Nose: Nose normal.     Mouth/Throat:     Mouth: Mucous membranes are moist.  Eyes:     Extraocular Movements: Extraocular movements intact.      Conjunctiva/sclera: Conjunctivae normal.     Pupils: Pupils are equal, round, and reactive to light.  Cardiovascular:     Rate and Rhythm: Regular rhythm. Tachycardia present.     Pulses: Normal pulses.     Heart sounds: Normal heart sounds. No murmur heard. Pulmonary:     Effort: Pulmonary effort is normal. No respiratory distress.     Breath sounds: Normal breath sounds.  Abdominal:     Palpations: Abdomen is soft.     Tenderness: There is no abdominal tenderness.  Musculoskeletal:        General: No swelling.     Cervical back: Normal range of motion and neck supple.  Skin:    General: Skin is warm and dry.     Capillary Refill: Capillary refill takes less than 2 seconds.  Neurological:     General: No focal deficit present.     Mental Status: She is alert and oriented to person, place, and time.     Cranial Nerves: No cranial nerve deficit.     Sensory: No sensory deficit.     Motor: No weakness.     Coordination: Coordination normal.  Psychiatric:        Mood and Affect: Mood normal.     ED Results /  Procedures / Treatments   Labs (all labs ordered are listed, but only abnormal results are displayed) Labs Reviewed  CBC WITH DIFFERENTIAL/PLATELET - Abnormal; Notable for the following components:      Result Value   WBC 19.0 (*)    RBC 5.13 (*)    MCH 24.8 (*)    MCHC 29.4 (*)    RDW 18.8 (*)    Neutro Abs 12.2 (*)    Lymphs Abs 5.0 (*)    Monocytes Absolute 1.1 (*)    Abs Immature Granulocytes 0.46 (*)    All other components within normal limits  BASIC METABOLIC PANEL - Abnormal; Notable for the following components:   Sodium 132 (*)    Potassium 2.5 (*)    CO2 20 (*)    Glucose, Bld 155 (*)    Calcium 7.8 (*)    All other components within normal limits  MAGNESIUM    EKG EKG Interpretation Date/Time:  Friday May 02 2023 22:21:37 EDT Ventricular Rate:  104 PR Interval:  136 QRS Duration:  81 QT Interval:  344 QTC Calculation: 453 R  Axis:   38  Text Interpretation: Sinus tachycardia Confirmed by Virgina Norfolk [id number] on 05/02/2023 10:25:40 PM  Radiology No results found.  Procedures Procedures    Medications Ordered in ED Medications  magnesium sulfate IVPB 2 g 50 mL (2 g Intravenous New Bag/Given 05/02/23 2235)  potassium chloride 10 mEq in 100 mL IVPB (has no administration in time range)  lactated ringers bolus 1,000 mL (1,000 mLs Intravenous Bolus 05/02/23 2058)  cyclobenzaprine (FLEXERIL) tablet 5 mg (5 mg Oral Given 05/02/23 2239)  potassium chloride SA (KLOR-CON M) CR tablet 40 mEq (40 mEq Oral Given 05/02/23 2237)    ED Course/ Medical Decision Making/ A&P                                 Medical Decision Making Amount and/or Complexity of Data Reviewed Labs: ordered.  Risk Prescription drug management.   Mallory Mcintyre is here after lightheaded episodes/low blood pressure after donating plasma.  She has normal vitals after having an initial blood pressure of 81/53 but on my repeat is 115/100.  She ready gotten 500 cc of fluids with EMS.  She is tachycardic with them in the 130s but heart rate now in the low 100s.  I do suspect that she likely has some vasovagal response/some dehydration from giving plasma.  She has no fever.  She is very well-appearing.  She was in her normal state health today.  Have no concern for ACS or PE other acute process but will give additional fluid bolus and check basic labs and electrolytes.  She is no longer feeling nauseous.  Will reevaluate.  Per my review and interpretation labs she does have a potassium of 2.5 which I suspect is from her plasma donation.  She will be given 2 rounds of IV potassium as well as oral potassium.  She is eating and drinking without any issues.  She is having some spasms in her calf which I suspect are from her electrolyte imbalances.  She does want to leave but I have encouraged her to let us give her some IV potassium before she goes.  I will give  her some IV magnesium as well.  Lab work otherwise unremarkable.  She is got leukocytosis that is likely nonspecific and may be a stress reaction to her process today.  Have no concern for infectious process.    Overall will replete potassium.  She is amenable to stay for at least 2 rounds of IV potassium.  Hopefully this will resolve a lot of the spasms she is having in her legs.  I have handed her off to oncoming ED staff.  She might need further repletion if she still having some symptoms.  Please see their note for further results, evaluation, disposition of the patient.  She has been instructed to stop her hydrochlorothiazide as well and follow-up with primary care doctor to discuss that medication and recheck her potassium outpatient.  This chart was dictated using voice recognition software.  Despite best efforts to proofread,  errors can occur which can change the documentation meaning.         Final Clinical Impression(s) / ED Diagnoses Final diagnoses:  Hypokalemia  Lightheadedness    Rx / DC Orders ED Discharge Orders          Ordered    potassium chloride SA (KLOR-CON M) 20 MEQ tablet  2 times daily        05/02/23 2216              Virgina Norfolk, DO 05/02/23 2244    Virgina Norfolk, DO 05/02/23 2322

## 2023-05-02 NOTE — ED Triage Notes (Signed)
Pt [facility], she got up, lightheaded, tried to initiate IV, vomited, spasms. En route, 80/44, given IV bolus , hr 130 cbg 200. IV established in the right ac.
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
## 2023-05-15 ENCOUNTER — Encounter: Payer: Self-pay | Admitting: Family

## 2023-05-27 ENCOUNTER — Ambulatory Visit: Payer: Commercial Managed Care - PPO | Admitting: Family

## 2023-05-27 NOTE — Progress Notes (Deleted)
   Patient ID: Mallory Mcintyre, female    DOB: [birth year], 22 y.o.   MRN: [id number]  No chief complaint on file. HPI: Hypertension: Patient is currently maintained on the following medications for blood pressure: HCTZ 12.5 Failed meds include: none Patient reports good compliance with blood pressure medications. Patient denies chest pain, headaches, shortness of breath or swelling. Last 3 blood pressure readings in our office are as follows: BP Readings from Last 3 Encounters:  05/03/23 121/85  04/29/23 (!) 154/94  04/24/23 119/81      Assessment & Plan:  There are no diagnoses linked to this encounter.  Subjective:    Outpatient Medications Prior to Visit  Medication Sig Dispense Refill   hydrochlorothiazide (HYDRODIURIL) 12.5 MG tablet Take 1 tablet (12.5 mg total) by mouth daily. 30 tablet 2   potassium chloride SA (KLOR-CON M) 20 MEQ tablet Take 1 tablet (20 mEq total) by mouth 2 (two) times daily for 5 days. 10 tablet 0   No facility-administered medications prior to visit.   Past Medical History:  Diagnosis Date   Encounter for supervision of normal pregnancy, antepartum 10/20/2018    Nursing Staff Provider Office Location  [facility] Dating   LMP Language   English  Anatomy US   normal female  Flu Vaccine   Declined 10/20/2018 Genetic Screen  NIPS: low risk  AFP:  Negative   TDaP vaccine  Declined 04-26-19 Hgb A1C or  GTT   Third trimester 1hr GTT 79 Rhogam     LAB RESULTS  Feeding Plan  Breast Blood Type B/Positive/-- (03/10 1535)  Contraception  Undecided 04-26-19 Antibody Negati   History of preterm delivery 08/16/2022   Hx spontaneous PTD at 32 weeks   Scoliosis    Short interval between pregnancies affecting pregnancy, antepartum 11/27/2022   Delivered 08/2022     Past Surgical History:  Procedure Laterality Date   SPINAL FUSION  08/12/2013   No Known Allergies    Objective:    Physical Exam Vitals and nursing note reviewed.  Constitutional:      Appearance: Normal  appearance. She is obese.  Cardiovascular:     Rate and Rhythm: Normal rate and regular rhythm.  Pulmonary:     Effort: Pulmonary effort is normal.     Breath sounds: Normal breath sounds.  Musculoskeletal:        General: Normal range of motion.  Skin:    General: Skin is warm and dry.  Neurological:     Mental Status: She is alert.  Psychiatric:        Mood and Affect: Mood normal.        Behavior: Behavior normal.    LMP 04/26/2023 (Exact Date)  Wt Readings from Last 3 Encounters:  04/29/23 188 lb 12.8 oz (85.6 kg)  04/24/23 188 lb (85.3 kg)  04/23/23 188 lb (85.3 kg)       Dulce Sellar, NP

## 2023-12-26 ENCOUNTER — Telehealth: Payer: Self-pay

## 2023-12-26 ENCOUNTER — Other Ambulatory Visit (HOSPITAL_COMMUNITY): Payer: Self-pay

## 2023-12-26 ENCOUNTER — Ambulatory Visit: Admitting: Family

## 2023-12-26 ENCOUNTER — Other Ambulatory Visit (HOSPITAL_COMMUNITY)
Admission: RE | Admit: 2023-12-26 | Discharge: 2023-12-26 | Disposition: A | Discharge status: Home / Self Care | Source: Ambulatory Visit | Attending: Family | Admitting: Family

## 2023-12-26 ENCOUNTER — Encounter: Payer: Self-pay | Admitting: Family

## 2023-12-26 ENCOUNTER — Other Ambulatory Visit: Payer: Self-pay | Admitting: Family

## 2023-12-26 VITALS — BP 140/96 | HR 73 | Temp 97.9°F | Ht 65.0 in | Wt 184.2 lb

## 2023-12-26 DIAGNOSIS — Z0000 Encounter for general adult medical examination without abnormal findings: Secondary | ICD-10-CM

## 2023-12-26 DIAGNOSIS — N898 Other specified noninflammatory disorders of vagina: Secondary | ICD-10-CM | POA: Diagnosis present

## 2023-12-26 DIAGNOSIS — D500 Iron deficiency anemia secondary to blood loss (chronic): Secondary | ICD-10-CM

## 2023-12-26 DIAGNOSIS — Z0001 Encounter for general adult medical examination with abnormal findings: Secondary | ICD-10-CM

## 2023-12-26 DIAGNOSIS — I10 Essential (primary) hypertension: Secondary | ICD-10-CM

## 2023-12-26 DIAGNOSIS — Z113 Encounter for screening for infections with a predominantly sexual mode of transmission: Secondary | ICD-10-CM

## 2023-12-26 DIAGNOSIS — Z13220 Encounter for screening for lipoid disorders: Secondary | ICD-10-CM

## 2023-12-26 DIAGNOSIS — Z6830 Body mass index (BMI) 30.0-30.9, adult: Secondary | ICD-10-CM

## 2023-12-26 DIAGNOSIS — N926 Irregular menstruation, unspecified: Secondary | ICD-10-CM

## 2023-12-26 DIAGNOSIS — E669 Obesity, unspecified: Secondary | ICD-10-CM

## 2023-12-26 DIAGNOSIS — H6123 Impacted cerumen, bilateral: Secondary | ICD-10-CM | POA: Diagnosis not present

## 2023-12-26 DIAGNOSIS — R87612 Low grade squamous intraepithelial lesion on cytologic smear of cervix (LGSIL): Secondary | ICD-10-CM

## 2023-12-26 LAB — CBC WITH DIFFERENTIAL/PLATELET
Basophils Absolute: 0.1 10*3/uL (ref 0.0–0.1)
Basophils Relative: 0.5 % (ref 0.0–3.0)
Eosinophils Absolute: 0.2 10*3/uL (ref 0.0–0.7)
Eosinophils Relative: 1.7 % (ref 0.0–5.0)
HCT: 35.3 % — ABNORMAL LOW (ref 36.0–46.0)
Hemoglobin: 10.9 g/dL — ABNORMAL LOW (ref 12.0–15.0)
Lymphocytes Relative: 25.4 % (ref 12.0–46.0)
Lymphs Abs: 3.4 10*3/uL (ref 0.7–4.0)
MCHC: 30.9 g/dL (ref 30.0–36.0)
MCV: 86 fl (ref 78.0–100.0)
Monocytes Absolute: 0.8 10*3/uL (ref 0.1–1.0)
Monocytes Relative: 6.1 % (ref 3.0–12.0)
Neutro Abs: 8.9 10*3/uL — ABNORMAL HIGH (ref 1.4–7.7)
Neutrophils Relative %: 66.3 % (ref 43.0–77.0)
Platelets: 445 10*3/uL — ABNORMAL HIGH (ref 150.0–400.0)
RBC: 4.1 Mil/uL (ref 3.87–5.11)
RDW: 23 % — ABNORMAL HIGH (ref 11.5–15.5)
WBC: 13.5 10*3/uL — ABNORMAL HIGH (ref 4.0–10.5)

## 2023-12-26 LAB — COMPREHENSIVE METABOLIC PANEL WITH GFR
ALT: 49 U/L — ABNORMAL HIGH (ref 0–35)
AST: 39 U/L — ABNORMAL HIGH (ref 0–37)
Albumin: 4 g/dL (ref 3.5–5.2)
Alkaline Phosphatase: 112 U/L (ref 39–117)
BUN: 7 mg/dL (ref 6–23)
CO2: 29 meq/L (ref 19–32)
Calcium: 9.2 mg/dL (ref 8.4–10.5)
Chloride: 103 meq/L (ref 96–112)
Creatinine, Ser: 0.64 mg/dL (ref 0.40–1.20)
GFR: 123.65 mL/min (ref >60.00)
Glucose, Bld: 92 mg/dL (ref 70–99)
Potassium: 3.8 meq/L (ref 3.5–5.1)
Sodium: 139 meq/L (ref 135–145)
Total Bilirubin: 0.3 mg/dL (ref 0.2–1.2)
Total Protein: 7.4 g/dL (ref 6.0–8.3)

## 2023-12-26 LAB — LIPID PANEL
Cholesterol: 177 mg/dL (ref 0–200)
HDL: 27.6 mg/dL — ABNORMAL LOW (ref >39.00)
LDL Cholesterol: 112 mg/dL — ABNORMAL HIGH (ref 0–99)
NonHDL: 149.31
Total CHOL/HDL Ratio: 6
Triglycerides: 188 mg/dL — ABNORMAL HIGH (ref 0.0–149.0)
VLDL: 37.6 mg/dL (ref 0.0–40.0)

## 2023-12-26 MED ORDER — WEGOVY 0.25 MG/0.5ML ~~LOC~~ SOAJ
0.2500 mg | SUBCUTANEOUS | 1 refills | Status: DC
Start: 2023-12-26 — End: 2024-06-04

## 2023-12-26 MED ORDER — AMLODIPINE BESYLATE 5 MG PO TABS
5.0000 mg | ORAL_TABLET | Freq: Every day | ORAL | 5 refills | Status: DC
Start: 2023-12-26 — End: 2024-06-04

## 2023-12-26 MED ORDER — FLUCONAZOLE 150 MG PO TABS
ORAL_TABLET | ORAL | 0 refills | Status: DC
Start: 2023-12-26 — End: 2024-04-21

## 2023-12-26 NOTE — Progress Notes (Addendum)
 Phone [phone]  Subjective:   Patient is a 23 y.o. female presenting for annual physical.    Chief Complaint  Patient presents with   Annual Exam    Fasting w/ labs    Menstrual Problem    Pt c/o bleeding for 2 weeks   Vaginal Itching    Pt c/o vaginal itching and burning, Present for 4 days. Has tried diaper rash cream.   Discussed the use of AI scribe software for clinical note transcription with the patient, who gave verbal consent to proceed.  History of Present Illness Mallory Mcintyre is a 23 year old female who presents for annual physical and has complaints of  irregular menstrual cycles, weight gain and vaginal itching.  She experiences irregular menstrual cycles since late last year, with a pattern of a normal five to seven-day period followed by a skipped month. In April, she missed her period and is currently experiencing two weeks of spotting. Her cycles were regular during breastfeeding after childbirth a year ago. She discontinued birth control in 2019 due to side effects and has not used it since.  She has vaginal itching associated with her menstrual cycle. She has a history of bacterial vaginosis and yeast infections, often occurring together. There is no foul odor or dysuria currently.  She is concerned about stagnant weight and difficulty losing it. She has high blood pressure but has not experienced leg swelling since her last medication adjustment.  See problem oriented charting- ROS- full  review of systems was completed and negative except for what is noted in HPI above.  The following were reviewed and entered/updated in epic: Past Medical History:  Diagnosis Date   Encounter for supervision of normal pregnancy, antepartum 10/20/2018    Nursing Staff Provider Office Location  [facility] Dating   LMP Language   English  Anatomy US    normal female  Flu Vaccine   Declined 10/20/2018 Genetic Screen  NIPS: low risk  AFP:  Negative   TDaP vaccine  Declined 04-26-19 Hgb  A1C or  GTT   Third trimester 1hr GTT 79 Rhogam     LAB RESULTS  Feeding Plan  Breast Blood Type B/Positive/-- (03/10 1535)  Contraception  Undecided 04-26-19 Antibody Negati   History of preterm delivery 08/16/2022   Hx spontaneous PTD at 32 weeks   Scoliosis    Short interval between pregnancies affecting pregnancy, antepartum 11/27/2022   Delivered 08/2022     Patient Active Problem List   Diagnosis Date Noted   Essential (primary) hypertension 04/29/2023   Bilateral leg edema 04/29/2023   Gestational diabetes mellitus (GDM) in third trimester 08/13/2022   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   History of spinal fusion for scoliosis 09/05/2021   Multiple joint pain 09/05/2021   Alpha thalassemia silent carrier 11/03/2018   Marijuana use 10/21/2018   Past Surgical History:  Procedure Laterality Date   SPINAL FUSION  08/12/2013    Family History  Problem Relation Age of Onset   Heart disease Maternal Grandmother    Drug abuse Maternal Grandfather    COPD Maternal Grandfather    Alcohol abuse Maternal Grandfather    Diabetes Other    Asthma Other    Hypertension Other     Medications- reviewed and updated Current Outpatient Medications  Medication Sig Dispense Refill   amLODipine  (NORVASC ) 5 MG tablet Take 1 tablet (5 mg total) by mouth daily. 30 tablet 5   No current facility-administered medications for this visit.  Allergies-reviewed and updated No Known Allergies  Social History   Social History Narrative   Not on file    Objective:  BP (!) 140/96 (BP Location: Left Arm, Patient Position: Sitting, Cuff Size: Large)   Pulse 73   Temp 97.9 F (36.6 C) (Temporal)   Ht 5\' 5"  (1.651 m)   Wt 184 lb 3.2 oz (83.6 kg)   LMP 12/12/2023 (Exact Date)   SpO2 96%   Breastfeeding No   BMI 30.65 kg/m  Physical Exam Vitals and nursing note reviewed.  Constitutional:      Appearance: Normal appearance. She is obese.  HENT:     Head: Normocephalic.     Right  Ear: Tympanic membrane normal. There is impacted cerumen (TM wnl after disimpaction).     Left Ear: There is impacted cerumen.     Nose: Nose normal.     Mouth/Throat:     Mouth: Mucous membranes are moist.  Eyes:     Pupils: Pupils are equal, round, and reactive to light.  Cardiovascular:     Rate and Rhythm: Normal rate and regular rhythm.  Pulmonary:     Effort: Pulmonary effort is normal.     Breath sounds: Normal breath sounds.  Musculoskeletal:        General: Normal range of motion.     Cervical back: Normal range of motion.  Lymphadenopathy:     Cervical: No cervical adenopathy.  Skin:    General: Skin is warm and dry.  Neurological:     Mental Status: She is alert.  Psychiatric:        Mood and Affect: Mood normal.        Behavior: Behavior normal.     Assessment and Plan   Health Maintenance counseling: 1. Anticipatory guidance: Patient counseled regarding regular dental exams q6 months, eye exams,  avoiding smoking and second hand smoke, limiting alcohol to 1 beverage per day, no illicit drugs.   2. Risk factor reduction:  Advised patient of need for regular exercise and diet rich with fruits and vegetables to reduce risk of heart attack and stroke. Wt Readings from Last 3 Encounters:  12/26/23 184 lb 3.2 oz (83.6 kg)  04/29/23 188 lb 12.8 oz (85.6 kg)  04/24/23 188 lb (85.3 kg)   3. Immunizations/screenings/ancillary studies Immunization History  Administered Date(s) Administered   Tdap 07/18/2022   Health Maintenance Due  Topic Date Due   HPV VACCINES (1 - 3-dose series) Never done   CHLAMYDIA SCREENING  02/06/2023   COVID-19 Vaccine (1 - 2024-25 season) Never done    4. Cervical cancer screening: 2023 - needs f/u positive  5. Skin cancer screening- advised regular sunscreen use. Denies worrisome, changing, or new skin lesions.  6. Birth control/STD check: none/STD 7. Smoking associated screening: ex- smoker 8. Alcohol screening: rare  Assessment &  Plan Irregular menstrual cycle & need for PAP - Irregular cycles with amenorrhea and spotting. 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 Discussed hormonal and non-hormonal regulation options. Also history of LGSIL, requiring f/u. - Refer to gynecology for further evaluation and management.  Vaginal itching Likely yeast infection, no signs of BV or UTI. History of BV and yeast infections. - Perform swab test today  - Sending Diflucan  150mg  x 2d d/t high likelihood of yeast  Obesity -  Hard time losing baby weight after 1 year. Discussed weight loss medications and lifestyle changes, also has HTN now. - Wt. Loss strategies reviewed including portion control, less carbs including sweets,  eating most of calories earlier in day, drinking 64oz water qd, and establishing daily exercise routine. Pt verbalized understanding and will make above changes. She will make plans to start exercise after she finishes work and is going to look into gym memberships and report back in 1 month with her exercise plan. - Submit prior authorization for Wegovy  0.25mg  qweek, advised on use & SE - F/U in 1 month after starting medication  Hypertension Elevated blood pressure, previous hydrochlorothiazide  discontinued due to hypokalemia. Weight gain may contribute. - Start Amlodipine  5mg  qd, ok to take qhs if causes ankle swelling. - Advise dietary modifications: larger meals earlier, minimize carbohydrates, focus on protein and vegetables for dinner. - Encourage regular exercise. - F/U in 1 month  Earwax impaction Left earwax impaction causing muffled hearing, especially on left side. Earwax soft, manageable with cleaning. - Verbal consent received to perform bilateral ear lavage via Hydrogen peroxide/water mix solution. Curette used to remove visible cerumen. Pt tolerated well, complete evacuation of all cerumen obtained in right ear and most of left ear. Mild erythema but no bleeding noted in ear canals after procedure.  -  Recommend over-the-counter Debrox ear drops or generic equivalent for monthly use to minimize cerumen build up.  Recommended follow up:  Return in about 4 weeks (around 01/23/2024) for HTN, med refills, Weight loss. Future Appointments  Date Time Provider Department Center  01/06/2024  2:00 PM Timothy Ford, MD [facility] None    Lab/Order associations:  fasting    Mallory Gore, NP

## 2023-12-26 NOTE — Assessment & Plan Note (Signed)
 Hard time losing baby weight after 1 year. Discussed weight loss medications and lifestyle changes, also has HTN now. - Wt. Loss strategies reviewed including portion control, less carbs including sweets, eating most of calories earlier in day, drinking 64oz water qd, and establishing daily exercise routine. - Submit prior authorization for Wegovy 0.25mg  qweek, advised on use & SE - F/U in 1 month after starting medication

## 2023-12-26 NOTE — Assessment & Plan Note (Signed)
 Elevated blood pressure, previous hydrochlorothiazide  discontinued due to hypokalemia. Weight gain may contribute. - Start Amlodipine 5mg  qd, ok to take qhs if causes ankle swelling. - Advise dietary modifications: larger meals earlier, minimize carbohydrates, focus on protein and vegetables for dinner. - Encourage regular exercise. - F/U in 1 month

## 2023-12-26 NOTE — Patient Instructions (Addendum)
 It was very nice to see you today!  I will review your lab results via MyChart in a few days. I have gone ahead and sent over Diflucan  to treat a yeast infection, but will let you know if the swab indicates a different infection. I have sent over the referral to see the Gynecology office to talk about your irregular cycles & possible birth control. We will see if we can get [facility] approved for weight loss, we will let you know.  Start working on what we discussed today regarding your weight loss goals:  1) Eat small portions, ok to eat 6 mini meals vs. 3 big meals if this controls your hunger  better. 2) Eat until full and then STOP! This is your body telling you it has had enough. 3) Drink at least 64 oz water = 2 liters = 8, 8oz cups DAILY. 4) Eat most of your calories earlier in the day (if you work during the day).  Want to eat within a 8-10hour window if possible. No eating after 7pm, only no calorie drinks or water from 7p until bedtime. 5) Look for low carb, high protein foods/recipes. Avoid simple carbs including sweets, white bread, rice, potatoes, pasta. Look for whole grain/whole wheat/or almond flour if eating these foods. 6) High protein foods: meats (limit red meat), including [place], chicken, pork, FISH (best source) nuts, cheese, beans (black beans, soy/edamame beans are best). Protein drinks, bars are also good but must have low sugar, look for < 10 grams. 7) Avoid processed/refined sugar and artificial sweeteners if possible. Stevia, Erythritol, or Monk fruit sweetener are preferred, natural, no calorie sweeteners.  Natural sweeteners like Agave or honey in very small amounts are ok also.      PLEASE NOTE:  If you had any lab tests please let us  know if you have not heard back within a few days. You may see your results on MyChart before we have a chance to review them but we will give you a call once they are reviewed by us . If we ordered any referrals today, please let us   know if you have not heard from their office within the next week.    \

## 2023-12-26 NOTE — Telephone Encounter (Signed)
 Pharmacy Patient Advocate Encounter   Received notification from Physician's Office that prior authorization for Wegovy 0.25MG /0.5ML auto-injectors is required/requested.   Insurance verification completed.   The patient is insured through [facility] .   Per test claim: PA required; PA started via CoverMyMeds. KEY [id number] . Please see clinical question(s) below that I am not finding the answer to in her chart and advise.

## 2023-12-29 LAB — CERVICOVAGINAL ANCILLARY ONLY
Bacterial Vaginitis (gardnerella): NEGATIVE
Candida Glabrata: NEGATIVE
Candida Vaginitis: POSITIVE — AB
Chlamydia: NEGATIVE
Comment: NEGATIVE
Comment: NEGATIVE
Comment: NEGATIVE
Comment: NEGATIVE
Comment: NEGATIVE
Comment: NORMAL
Neisseria Gonorrhea: NEGATIVE
Trichomonas: NEGATIVE

## 2023-12-29 NOTE — Telephone Encounter (Signed)
 Clinical questions have been answered and PA submitted. PA currently Pending.

## 2023-12-29 NOTE — Telephone Encounter (Signed)
 I updated the plan of care under her Obesity diagnosis on the last visit note. Hopefully this is sufficient, thanks!

## 2023-12-30 ENCOUNTER — Ambulatory Visit: Payer: Self-pay | Admitting: Family

## 2023-12-30 DIAGNOSIS — D500 Iron deficiency anemia secondary to blood loss (chronic): Secondary | ICD-10-CM

## 2023-12-30 DIAGNOSIS — H6122 Impacted cerumen, left ear: Secondary | ICD-10-CM

## 2023-12-30 DIAGNOSIS — H6120 Impacted cerumen, unspecified ear: Secondary | ICD-10-CM

## 2023-12-30 LAB — TSH: TSH: 1.15 u[IU]/mL (ref 0.35–5.50)

## 2023-12-30 MED ORDER — FERROUS SULFATE 325 (65 FE) MG PO TBEC
325.0000 mg | DELAYED_RELEASE_TABLET | Freq: Every day | ORAL | 5 refills | Status: DC
Start: 2023-12-30 — End: 2024-04-21

## 2023-12-30 MED ORDER — CLOTRIMAZOLE 3 2 % VA CREA: 1.0000 | TOPICAL_CREAM | Freq: Every day | VAGINAL | 0 refills | Status: AC

## 2023-12-30 NOTE — Addendum Note (Signed)
 Addended by: Dicky Boer on: 12/30/2023 06:44 PM   Modules accepted: Orders

## 2023-12-30 NOTE — Addendum Note (Signed)
 Addended by: Psalms Olarte on: 12/30/2023 06:39 PM   Modules accepted: Orders

## 2023-12-31 MED ORDER — FERROUS SULFATE 325 (65 FE) MG PO TBEC
325.0000 mg | DELAYED_RELEASE_TABLET | Freq: Every day | ORAL | 5 refills | Status: DC
Start: 2023-12-31 — End: 2024-06-04

## 2023-12-31 NOTE — Telephone Encounter (Signed)
 Pharmacy Patient Advocate Encounter  Received notification from [employer] that Prior Authorization for Wegovy  0.25MG /0.5ML auto-injectors  has been DENIED.  Full denial letter will be uploaded to the media tab. See denial reason below.   PA #/Case ID/Reference #: [id number]

## 2024-01-06 ENCOUNTER — Other Ambulatory Visit (INDEPENDENT_AMBULATORY_CARE_PROVIDER_SITE_OTHER): Payer: Self-pay

## 2024-01-06 ENCOUNTER — Ambulatory Visit: Admitting: Orthopedic Surgery

## 2024-01-06 ENCOUNTER — Encounter: Payer: Self-pay | Admitting: Orthopedic Surgery

## 2024-01-06 DIAGNOSIS — M549 Dorsalgia, unspecified: Secondary | ICD-10-CM | POA: Diagnosis not present

## 2024-01-06 DIAGNOSIS — M545 Low back pain, unspecified: Secondary | ICD-10-CM | POA: Diagnosis not present

## 2024-01-06 DIAGNOSIS — M542 Cervicalgia: Secondary | ICD-10-CM | POA: Diagnosis not present

## 2024-01-06 DIAGNOSIS — G8929 Other chronic pain: Secondary | ICD-10-CM | POA: Diagnosis not present

## 2024-01-06 DIAGNOSIS — M546 Pain in thoracic spine: Secondary | ICD-10-CM

## 2024-01-06 DIAGNOSIS — Z9889 Other specified postprocedural states: Secondary | ICD-10-CM

## 2024-01-06 MED ORDER — METHOCARBAMOL 500 MG PO TABS: 500.0000 mg | ORAL_TABLET | Freq: Three times a day (TID) | ORAL | 0 refills | Status: AC | PRN

## 2024-01-06 NOTE — Telephone Encounter (Signed)
 I indicated what the patient has tried and that she is planning to continue to work on exercise and I specified the changes to her diet. Do you know what else they are wanting me to say? Should I order a nutrition referral? Do I have to contact them to appeal? Thanks!

## 2024-01-06 NOTE — Telephone Encounter (Signed)
 send referral to ENT for cerumen impaction - left ear - thx

## 2024-01-06 NOTE — Progress Notes (Signed)
 Office Visit Note   Patient: Mallory Mcintyre           Date of Birth: 2001/07/20           MRN: [id number] Visit Date: 01/06/2024              Requested by: Versa Gore, NP [address] [place],  [place] [address] PCP: Versa Gore, NP  Chief Complaint  Patient presents with   Middle Back - Pain   Lower Back - Pain   Neck - Pain      HPI: Patient is a 23 year old woman who is seen for initial evaluation for neck thoracic and lower back pain.  Patient states her pain is primarily midline.  She is status post T5-L3 fusion for scoliosis in 2015.  Patient states she has been having increasing pain for the last several months.  Assessment & Plan: Visit Diagnoses:  1. Pain in thoracic spine   2. Chronic low back pain, unspecified back pain laterality, unspecified whether sciatica present   3. Neck pain   4. Back pain with history of spinal surgery     Plan: Will send in a prescription for Robaxin to help with the muscle spasm of her neck.  Will set her up for physical therapy upstairs.  Follow-Up Instructions: No follow-ups on file.   Ortho Exam  Patient is alert, oriented, no adenopathy, well-dressed, normal affect, normal respiratory effort. Examination patient has a negative straight leg raise bilaterally no focal motor weakness in either lower extremity.  Upper extremities show full range of motion with no focal motor weakness.  No radicular pain in the legs or arms.  Imaging: XR Cervical Spine 2 or 3 views Result Date: 01/06/2024 2 view radiographs of the cervical spine shows straightening of the cervical lordosis.  No degenerative changes.  XR Lumbar Spine 2-3 Views Result Date: 01/06/2024 2 view radiographs of the lumbar spine shows stable internal fixation.  There is no hardware loosening or failure.  No degenerative changes of the adjacent segments.  XR Thoracic Spine 2 View Result Date: 01/06/2024 2 view radiographs of the thoracic spine shows stable  internal fixation for scoliosis without hardware complications.  No images are attached to the encounter.  Labs: Lab Results  Component Value Date   HGBA1C 5.4 03/20/2022   ESRSEDRATE 28 (H) 09/05/2021   CRP <1.0 09/05/2021   REPTSTATUS 02/06/2022 FINAL 02/04/2022   CULT 60,000 COLONIES/mL STAPHYLOCOCCUS EPIDERMIDIS (A) 02/04/2022   LABORGA STAPHYLOCOCCUS EPIDERMIDIS (A) 02/04/2022     Lab Results  Component Value Date   ALBUMIN 4.0 12/26/2023   ALBUMIN 3.9 04/24/2023   ALBUMIN 3.5 04/23/2023    Lab Results  Component Value Date   MG 2.4 05/02/2023   No results found for: "VD25OH"  No results found for: "PREALBUMIN"    Latest Ref Rng & Units 12/26/2023   12:03 PM 05/02/2023    8:56 PM 04/29/2023   12:15 PM  CBC EXTENDED  WBC 4.0 - 10.5 K/uL 13.5  19.0  13.6   RBC 3.87 - 5.11 Mil/uL 4.10  5.13  4.41   Hemoglobin 12.0 - 15.0 g/dL 56.3  87.5  64.3   HCT 36.0 - 46.0 % 35.3  43.2  36.0   Platelets 150.0 - 400.0 K/uL 445.0  370  456.0   NEUT# 1.4 - 7.7 K/uL 8.9  12.2  9.2   Lymph# 0.7 - 4.0 K/uL 3.4  5.0  3.4      There is no height  or weight on file to calculate BMI.  Orders:  Orders Placed This Encounter  Procedures   XR Thoracic Spine 2 View   XR Lumbar Spine 2-3 Views   XR Cervical Spine 2 or 3 views   Ambulatory referral to Physical Therapy   Meds ordered this encounter  Medications   methocarbamol (ROBAXIN) 500 MG tablet    Sig: Take 1 tablet (500 mg total) by mouth every 8 (eight) hours as needed for muscle spasms.    Dispense:  30 tablet    Refill:  0     Procedures: No procedures performed  Clinical Data: No additional findings.  ROS:  All other systems negative, except as noted in the HPI. Review of Systems  Objective: Vital Signs: LMP 12/12/2023 (Exact Date)   Specialty Comments:  No specialty comments available.  PMFS History: Patient Active Problem List   Diagnosis Date Noted   Obesity (BMI 30-39.9) 12/26/2023   Essential  (primary) hypertension 04/29/2023   Gestational diabetes mellitus (GDM) in third trimester 08/13/2022   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   History of spinal fusion for scoliosis 09/05/2021   Multiple joint pain 09/05/2021   Alpha thalassemia silent carrier 11/03/2018   Marijuana use 10/21/2018   Past Medical History:  Diagnosis Date   Bilateral leg edema 04/29/2023   Encounter for supervision of normal pregnancy, antepartum 10/20/2018    Nursing Staff Provider Office Location  [facility] Dating   LMP Language   English  Anatomy US    normal female  Flu Vaccine   Declined 10/20/2018 Genetic Screen  NIPS: low risk  AFP:  Negative   TDaP vaccine  Declined 04-26-19 Hgb A1C or  GTT   Third trimester 1hr GTT 79 Rhogam     LAB RESULTS  Feeding Plan  Breast Blood Type B/Positive/-- (03/10 1535)  Contraception  Undecided 04-26-19 Antibody Negati   History of preterm delivery 08/16/2022   Hx spontaneous PTD at 32 weeks   Scoliosis    Short interval between pregnancies affecting pregnancy, antepartum 11/27/2022   Delivered 08/2022      Family History  Problem Relation Age of Onset   Heart disease Maternal Grandmother    Drug abuse Maternal Grandfather    COPD Maternal Grandfather    Alcohol abuse Maternal Grandfather    Diabetes Other    Asthma Other    Hypertension Other     Past Surgical History:  Procedure Laterality Date   SPINAL FUSION  08/12/2013   Social History   Occupational History   Not on file  Tobacco Use   Smoking status: Former    Types: Cigars   Smokeless tobacco: Never   Tobacco comments:    2-3 Black n mild daily   Vaping Use   Vaping status: Never Used  Substance and Sexual Activity   Alcohol use: Not Currently   Drug use: Not Currently    Types: Marijuana    Comment: last smoked June 2020   Sexual activity: Not Currently    Partners: Male    Birth control/protection: None

## 2024-01-07 ENCOUNTER — Telehealth: Payer: Self-pay | Admitting: Pharmacist

## 2024-01-07 NOTE — Telephone Encounter (Addendum)
 PA resubmitted. Key: [id number]

## 2024-01-07 NOTE — Telephone Encounter (Addendum)
 Based on denial, chart notes must say that patient is currently on a lifestyle change AND she will continue. Chart notes says she is planning on changing and not what she is actually doing (nutrition change or physical activity).   I may be able to resubmit if chart notes are updated. If plan does not let me resubmit, I can send encounter to our pharmacist to review for appeal.

## 2024-01-07 NOTE — Telephone Encounter (Signed)
 I addended the visit note again, trying to be more specific. Thanks for resubmitting and if the pharmacist can appeal if needed, that would be perfect.

## 2024-01-07 NOTE — Addendum Note (Signed)
 Addended by: Alvie Jolly on: 01/07/2024 09:13 AM   Modules accepted: Orders

## 2024-01-07 NOTE — Telephone Encounter (Signed)
 Appeal has been submitted for [employer] . Will advise when response is received, please be advised that most companies may take 30 days to make a decision. Appeal letter and supporting documentation have been faxed to [phone] on 01/07/2024 @6 :01 pm.  Thank you, Dene Fines, PharmD Clinical Pharmacist  [facility]  Direct Dial: [phone]

## 2024-01-07 NOTE — Telephone Encounter (Signed)
 Information has been sent to clinical pharmacist for appeals review. It may take 5-7 days to prepare the necessary documentation to request the appeal from the insurance.

## 2024-01-07 NOTE — Telephone Encounter (Signed)
 Pharmacy Patient Advocate Encounter  Received notification from [employer] that Prior Authorization for Wegovy  0.25MG /0.5ML auto-injectors has been DENIED.  Full denial letter will be uploaded to the media tab. See denial reason below.   PA #/Case ID/Reference #:  [id number]

## 2024-01-15 ENCOUNTER — Other Ambulatory Visit (HOSPITAL_COMMUNITY): Payer: Self-pay

## 2024-01-15 NOTE — Therapy (Incomplete)
 OUTPATIENT PHYSICAL THERAPY CERVICAL AND THORACOLUMBAR EVALUATION   Patient Name: Mallory Mcintyre MRN: [id number] DOB:2001/02/09, 23 y.o., female Today's Date: 01/15/2024  END OF SESSION:   Past Medical History:  Diagnosis Date   Bilateral leg edema 04/29/2023   Encounter for supervision of normal pregnancy, antepartum 10/20/2018    Nursing Staff Provider Office Location  [facility] Dating   LMP Language   English  Anatomy US    normal female  Flu Vaccine   Declined 10/20/2018 Genetic Screen  NIPS: low risk  AFP:  Negative   TDaP vaccine  Declined 04-26-19 Hgb A1C or  GTT   Third trimester 1hr GTT 79 Rhogam     LAB RESULTS  Feeding Plan  Breast Blood Type B/Positive/-- (03/10 1535)  Contraception  Undecided 04-26-19 Antibody Negati   History of preterm delivery 08/16/2022   Hx spontaneous PTD at 32 weeks   Scoliosis    Short interval between pregnancies affecting pregnancy, antepartum 11/27/2022   Delivered 08/2022     Past Surgical History:  Procedure Laterality Date   SPINAL FUSION  08/12/2013   Patient Active Problem List   Diagnosis Date Noted   Obesity (BMI 30-39.9) 12/26/2023   Essential (primary) hypertension 04/29/2023   Gestational diabetes mellitus (GDM) in third trimester 08/13/2022   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   History of spinal fusion for scoliosis 09/05/2021   Multiple joint pain 09/05/2021   Alpha thalassemia silent carrier 11/03/2018   Marijuana use 10/21/2018    PCP: ***  REFERRING PROVIDER: ***  REFERRING DIAG:  M54.6 (ICD-10-CM) - Pain in thoracic spine  M54.50,G89.29 (ICD-10-CM) - Chronic low back pain, unspecified back pain laterality, unspecified whether sciatica present  M54.2 (ICD-10-CM) - Neck pain  M54.9,Z98.890 (ICD-10-CM) - Back pain with history of spinal surgery    Rationale for Evaluation and Treatment: Rehabilitation  THERAPY DIAG:  No diagnosis found.  ONSET DATE: ***  SUBJECTIVE:                                                                                                                                                                                            SUBJECTIVE STATEMENT: status post T5-L3 fusion for scoliosis in 2015   PERTINENT HISTORY:  ***  PAIN:  Are you having pain? Yes: NPRS scale: *** Pain location: *** Pain description: *** Aggravating factors: *** Relieving factors: ***  PRECAUTIONS: {Therapy precautions:24002}  RED FLAGS: {PT Red Flags:29287}   WEIGHT BEARING RESTRICTIONS: No  FALLS:  Has patient fallen in last 6 months? {fallsyesno:27318}  LIVING ENVIRONMENT: Lives with: {[facility] lives with:25569::"lives with their family"} Lives in: {Lives in:25570} Stairs: {opstairs:27293} Has  following equipment at home: {Assistive devices:23999}  OCCUPATION: ***  PLOF: {PLOF:24004}  PATIENT GOALS: ***  NEXT MD VISIT: ***  OBJECTIVE:  Note: Objective measures were completed at Evaluation unless otherwise noted.  DIAGNOSTIC FINDINGS:  ***  PATIENT SURVEYS:  {rehab surveys:24030}  COGNITION: Overall cognitive status: {cognition:24006}     SENSATION: {sensation:27233}   CERVICAL ROM:   {AROM/PROM:27142} ROM A/PROM (deg) eval  Flexion   Extension   Right lateral flexion   Left lateral flexion   Right rotation   Left rotation    (Blank rows = not tested)  UPPER EXTREMITY ROM:  Active ROM Right eval Left eval  Shoulder flexion    Shoulder extension    Shoulder abduction    Shoulder adduction    Shoulder extension    Shoulder internal rotation    Shoulder external rotation    Elbow flexion    Elbow extension    Wrist flexion    Wrist extension    Wrist ulnar deviation    Wrist radial deviation    Wrist pronation    Wrist supination     (Blank rows = not tested)  UPPER EXTREMITY MMT:  MMT Right eval Left eval  Shoulder flexion    Shoulder extension    Shoulder abduction    Shoulder adduction    Shoulder extension    Shoulder internal rotation     Shoulder external rotation    Middle trapezius    Lower trapezius    Elbow flexion    Elbow extension    Wrist flexion    Wrist extension    Wrist ulnar deviation    Wrist radial deviation    Wrist pronation    Wrist supination    Grip strength     (Blank rows = not tested)  CERVICAL SPECIAL TESTS:  {Cervical special tests:25246}    MUSCLE LENGTH: Hamstrings: Right *** deg; Left *** deg Thomas test: Right *** deg; Left *** deg  POSTURE: {posture:25561}  PALPATION: ***  LUMBAR ROM:   AROM eval  Flexion   Extension   Right lateral flexion   Left lateral flexion   Right rotation   Left rotation    (Blank rows = not tested)  LOWER EXTREMITY ROM:     Active  Right eval Left eval  Hip flexion    Hip extension    Hip abduction    Hip adduction    Hip internal rotation    Hip external rotation    Knee flexion    Knee extension    Ankle dorsiflexion    Ankle plantarflexion    Ankle inversion    Ankle eversion     (Blank rows = not tested)  LOWER EXTREMITY MMT:    MMT Right eval Left eval  Hip flexion    Hip extension    Hip abduction    Hip adduction    Hip internal rotation    Hip external rotation    Knee flexion    Knee extension    Ankle dorsiflexion    Ankle plantarflexion    Ankle inversion    Ankle eversion     (Blank rows = not tested)  LUMBAR SPECIAL TESTS:  {lumbar special test:25242}  FUNCTIONAL TESTS:  5 times sit to stand: *** 6 minute walk test: *** PLank  GAIT: Distance walked: *** Assistive device utilized: {Assistive devices:23999} Level of assistance: {Levels of assistance:24026} Comments: ***  TREATMENT DATE: ***  PATIENT EDUCATION:  Education details: *** Person educated: {Person educated:25204} Education method: {Education Method:25205} Education comprehension: {Education  Comprehension:25206}  HOME EXERCISE PROGRAM: Access Code: [email or web address] URL: [email or web address] Date: 01/20/2024 Prepared by: Sharlet Dawson  Exercises - Supine Double Bent Leg Lift  - 1 x daily - 7 x weekly - 3 sets - 10 reps - Plank with Elbows on Table  - 1 x daily - 7 x weekly - 3 sets - 10 reps - Supine Deep Neck Flexor Training  - 2-3 x daily - 7 x weekly - 10 reps - 3 hold - Seated Gentle Upper Trapezius Stretch  - 1 x daily - 7 x weekly - 1 sets - 3 reps - 30 hold - Gentle Levator Scapulae Stretch  - 1 x daily - 7 x weekly - 3 sets - 3 reps - 30 hold - Standing Shoulder Diagonal Horizontal Abduction 60/120 Degrees with Resistance  - 1 x daily - 7 x weekly - 3 sets - 10 reps - Standing Shoulder Horizontal Abduction with Resistance  - 1 x daily - 7 x weekly - 3 sets - 10 reps  ASSESSMENT:  CLINICAL IMPRESSION: Patient is a 23 y.o. female who was seen today for physical therapy evaluation and treatment for cervical,thoracic and low back pain status post T5-L3 fusion for scoliosis in 2015   OBJECTIVE IMPAIRMENTS: {opptimpairments:25111}.   ACTIVITY LIMITATIONS: {activitylimitations:27494}  PARTICIPATION LIMITATIONS: {participationrestrictions:25113}  PERSONAL FACTORS: {Personal factors:25162} are also affecting patient's functional outcome.   REHAB POTENTIAL: {rehabpotential:25112}  CLINICAL DECISION MAKING: {clinical decision making:25114}  EVALUATION COMPLEXITY: {Evaluation complexity:25115}   GOALS: Goals reviewed with patient? {yes/no:20286}  SHORT TERM GOALS: Target date: 02-17-23  *** Baseline: Goal status: INITIAL  2.  *** Baseline:  Goal status: INITIAL  3.  *** Baseline:  Goal status: INITIAL  4.  *** Baseline:  Goal status: INITIAL  5.  *** Baseline:  Goal status: INITIAL  6.  *** Baseline:  Goal status: INITIAL  LONG TERM GOALS: Target date: 03-15-24  *** Baseline:  Goal status: INITIAL  2.  *** Baseline:  Goal status:  INITIAL  3.  *** Baseline:  Goal status: INITIAL  4.  *** Baseline:  Goal status: INITIAL  5.  *** Baseline:  Goal status: INITIAL  6.  *** Baseline:  Goal status: INITIAL  PLAN:  PT FREQUENCY: {rehab frequency:25116}  PT DURATION: {rehab duration:25117}  PLANNED INTERVENTIONS: {rehab planned interventions:25118::"97110-Therapeutic exercises","97530- Therapeutic [phone]- Neuromuscular re-education","97535- Self AOZH","08657- Manual therapy"}.  PLAN FOR NEXT SESSION: ***   ***

## 2024-01-19 ENCOUNTER — Other Ambulatory Visit (HOSPITAL_COMMUNITY): Payer: Self-pay

## 2024-01-19 NOTE — Telephone Encounter (Signed)
 Insurance has approved the appeal for [employer]  through 07/14/2024:     Thank you, Dene Fines, PharmD Clinical Pharmacist  [facility]  Direct Dial: [phone]

## 2024-01-20 ENCOUNTER — Ambulatory Visit: Attending: Orthopedic Surgery | Admitting: Physical Therapy

## 2024-01-26 ENCOUNTER — Ambulatory Visit: Admitting: Family

## 2024-02-02 ENCOUNTER — Ambulatory Visit: Admitting: Family

## 2024-02-03 ENCOUNTER — Ambulatory Visit: Admitting: Orthopedic Surgery

## 2024-04-21 ENCOUNTER — Emergency Department (HOSPITAL_COMMUNITY)
Admission: EM | Admit: 2024-04-21 | Discharge: 2024-04-21 | Disposition: A | Discharge status: Against Medical Advice | Attending: Emergency Medicine | Admitting: Emergency Medicine

## 2024-04-21 ENCOUNTER — Encounter (HOSPITAL_COMMUNITY): Payer: Self-pay

## 2024-04-21 ENCOUNTER — Ambulatory Visit (HOSPITAL_COMMUNITY): Admission: EM | Admit: 2024-04-21 | Discharge: 2024-04-21 | Disposition: A | Discharge status: Home / Self Care

## 2024-04-21 ENCOUNTER — Other Ambulatory Visit: Payer: Self-pay

## 2024-04-21 DIAGNOSIS — R519 Headache, unspecified: Secondary | ICD-10-CM | POA: Diagnosis not present

## 2024-04-21 DIAGNOSIS — Z5321 Procedure and treatment not carried out due to patient leaving prior to being seen by health care provider: Secondary | ICD-10-CM | POA: Insufficient documentation

## 2024-04-21 LAB — BASIC METABOLIC PANEL WITH GFR
Anion gap: 9 (ref 5–15)
BUN: <5 mg/dL — ABNORMAL LOW (ref 6–20)
CO2: 25 mmol/L (ref 22–32)
Calcium: 8.5 mg/dL — ABNORMAL LOW (ref 8.9–10.3)
Chloride: 105 mmol/L (ref 98–111)
Creatinine, Ser: 0.63 mg/dL (ref 0.44–1.00)
GFR, Estimated: >60 mL/min (ref >60)
Glucose, Bld: 105 mg/dL — ABNORMAL HIGH (ref 70–99)
Potassium: 3.9 mmol/L (ref 3.5–5.1)
Sodium: 139 mmol/L (ref 135–145)

## 2024-04-21 LAB — URINALYSIS, ROUTINE W REFLEX MICROSCOPIC
Bilirubin Urine: NEGATIVE
Glucose, UA: NEGATIVE mg/dL
Hgb urine dipstick: NEGATIVE
Ketones, ur: NEGATIVE mg/dL
Nitrite: NEGATIVE
Protein, ur: NEGATIVE mg/dL
Specific Gravity, Urine: 1.017 (ref 1.005–1.030)
pH: 7 (ref 5.0–8.0)

## 2024-04-21 LAB — CBC WITH DIFFERENTIAL/PLATELET
Basophils Absolute: 0.1 K/uL (ref 0.0–0.1)
Basophils Relative: 1 %
Eosinophils Absolute: 0.1 K/uL (ref 0.0–0.5)
Eosinophils Relative: 1 %
HCT: 31.2 % — ABNORMAL LOW (ref 36.0–46.0)
Hemoglobin: 9.3 g/dL — ABNORMAL LOW (ref 12.0–15.0)
Lymphocytes Relative: 18 %
Lymphs Abs: 2.4 K/uL (ref 0.7–4.0)
MCH: 25 pg — ABNORMAL LOW (ref 26.0–34.0)
MCHC: 29.8 g/dL — ABNORMAL LOW (ref 30.0–36.0)
MCV: 83.9 fL (ref 80.0–100.0)
Monocytes Absolute: 0.3 K/uL (ref 0.1–1.0)
Monocytes Relative: 2 %
Neutro Abs: 10.6 K/uL — ABNORMAL HIGH (ref 1.7–7.7)
Neutrophils Relative %: 78 %
Platelets: 536 K/uL — ABNORMAL HIGH (ref 150–400)
RBC: 3.72 MIL/uL — ABNORMAL LOW (ref 3.87–5.11)
RDW: 22.3 % — ABNORMAL HIGH (ref 11.5–15.5)
WBC: 13.6 K/uL — ABNORMAL HIGH (ref 4.0–10.5)
nRBC: 0 % (ref 0.0–0.2)

## 2024-04-21 LAB — POC URINE PREG, ED: Preg Test, Ur: NEGATIVE

## 2024-04-21 NOTE — ED Provider Notes (Signed)
 [facility]    CSN: [id number] Arrival date & time: 04/21/24  0847      History   Chief Complaint Chief Complaint  Patient presents with   Headache    HPI Mallory Mcintyre is a 23 y.o. female.   Patient presents today with a several hour history of severe headache.  She reports that she was sleeping when she suddenly woke up with a severe headache at around 3 or 4:00 this morning.  She took some Tylenol  and drink some water but did not have any relief prompting evaluation today.  She denies any history of primary headache disorder including migraines in herself or family members.  She denies any recent head trauma.  She reports that pain is rated 10 on a 0-10 pain scale, described as throbbing, generalized throughout her cranium, no aggravating or alleviating factors identified.  This is the worst headache she has ever had.  She denies any recent illness including fever, cough, congestion.  She does report associated nausea but denies any vomiting or diarrhea.  She denies any recent medication changes.  She is having difficulty with her daily activities as a result of her symptoms.  She denies any associated aura.  She does report associated photophobia but denies any phonophobia, visual disturbance, loss of consciousness/syncope, dysarthria, focal weakness.    Past Medical History:  Diagnosis Date   Bilateral leg edema 04/29/2023   Encounter for supervision of normal pregnancy, antepartum 10/20/2018    Nursing Staff Provider Office Location  [facility] Dating   LMP Language   English  Anatomy US    normal female  Flu Vaccine   Declined 10/20/2018 Genetic Screen  NIPS: low risk  AFP:  Negative   TDaP vaccine  Declined 04-26-19 Hgb A1C or  GTT   Third trimester 1hr GTT 79 Rhogam     LAB RESULTS  Feeding Plan  Breast Blood Type B/Positive/-- (03/10 1535)  Contraception  Undecided 04-26-19 Antibody Negati   History of preterm delivery 08/16/2022   Hx spontaneous PTD at 32 weeks   Scoliosis     Short interval between pregnancies affecting pregnancy, antepartum 11/27/2022   Delivered 08/2022      Patient Active Problem List   Diagnosis Date Noted   Obesity (BMI 30-39.9) 12/26/2023   Essential (primary) hypertension 04/29/2023   Gestational diabetes mellitus (GDM) in third trimester 08/13/2022   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   History of spinal fusion for scoliosis 09/05/2021   Multiple joint pain 09/05/2021   Alpha thalassemia silent carrier 11/03/2018   Marijuana use 10/21/2018    Past Surgical History:  Procedure Laterality Date   SPINAL FUSION  08/12/2013    OB History     Gravida  3   Para  2   Term  1   Preterm  1   AB  0   Living  2      SAB  0   IAB  0   Ectopic  0   Multiple  0   Live Births  2        Obstetric Comments  LMP: 03/14/2021          Home Medications    Prior to Admission medications   Medication Sig Start Date End Date Taking? Authorizing Provider  amLODipine  (NORVASC ) 5 MG tablet Take 1 tablet (5 mg total) by mouth daily. 12/26/23   Lucius Krabbe, NP  clotrimazole  (CLOTRIMAZOLE  3) 2 % vaginal cream Place 1 Applicatorful vaginally at bedtime.  12/30/23   Lucius Krabbe, NP  ferrous sulfate  325 (65 FE) MG EC tablet Take 1 tablet (325 mg total) by mouth daily with breakfast. 12/31/23   Lucius Krabbe, NP  methocarbamol  (ROBAXIN ) 500 MG tablet Take 1 tablet (500 mg total) by mouth every 8 (eight) hours as needed for muscle spasms. 01/06/24   Harden Jerona GAILS, MD  Semaglutide -Weight Management (WEGOVY ) 0.25 MG/0.5ML SOAJ Inject 0.25 mg into the skin once a week. Patient not taking: Reported on 04/21/2024 12/26/23   Lucius Krabbe, NP    Family History Family History  Problem Relation Age of Onset   Heart disease Maternal Grandmother    Drug abuse Maternal Grandfather    COPD Maternal Grandfather    Alcohol abuse Maternal Grandfather    Diabetes Other    Asthma Other    Hypertension Other      Social History Social History   Tobacco Use   Smoking status: Every Day    Types: Cigars   Smokeless tobacco: Never   Tobacco comments:    2-3 Black n mild daily   Vaping Use   Vaping status: Never Used  Substance Use Topics   Alcohol use: Not Currently   Drug use: Not Currently    Types: Marijuana    Comment: last smoked June 2020     Allergies   Patient has no known allergies.   Review of Systems Review of Systems  Constitutional:  Positive for activity change. Negative for appetite change, fatigue and fever.  HENT:  Negative for congestion and sore throat.   Eyes:  Positive for photophobia. Negative for visual disturbance.  Respiratory:  Negative for cough and shortness of breath.   Cardiovascular:  Negative for chest pain.  Gastrointestinal:  Negative for abdominal pain, diarrhea, nausea and vomiting.  Musculoskeletal:  Negative for arthralgias and myalgias.  Neurological:  Positive for headaches. Negative for dizziness, syncope, facial asymmetry, speech difficulty, weakness, light-headedness and numbness.     Physical Exam Triage Vital Signs ED Triage Vitals  Encounter Vitals Group     BP 04/21/24 0916 (!) 152/96     Girls Systolic BP Percentile --      Girls Diastolic BP Percentile --      Boys Systolic BP Percentile --      Boys Diastolic BP Percentile --      Pulse Rate 04/21/24 0916 90     Resp 04/21/24 0916 16     Temp 04/21/24 0916 98.6 F (37 C)     Temp Source 04/21/24 0916 Oral     SpO2 04/21/24 0916 98 %     Weight --      Height --      Head Circumference --      Peak Flow --      Pain Score 04/21/24 0917 10     Pain Loc --      Pain Education --      Exclude from Growth Chart --    No data found.  Updated Vital Signs BP (!) 152/96 (BP Location: Left Arm)   Pulse 90   Temp 98.6 F (37 C) (Oral)   Resp 16   LMP 04/14/2024 (Exact Date)   SpO2 98%   Breastfeeding No   Visual Acuity Right Eye Distance:   Left Eye Distance:    Bilateral Distance:    Right Eye Near:   Left Eye Near:    Bilateral Near:     Physical Exam Vitals reviewed.  Constitutional:  General: She is awake. She is not in acute distress.    Appearance: Normal appearance. She is well-developed. She is not ill-appearing.     Comments: Very pleasant female appears stated age in no acute distress sitting comfortably in exam room  HENT:     Head: Normocephalic and atraumatic. No raccoon eyes, Battle's sign or contusion.     Right Ear: Tympanic membrane, ear canal and external ear normal. No hemotympanum.     Left Ear: Tympanic membrane, ear canal and external ear normal. No hemotympanum.     Mouth/Throat:     Tongue: Tongue does not deviate from midline.     Pharynx: Uvula midline. No oropharyngeal exudate or posterior oropharyngeal erythema.  Eyes:     Extraocular Movements: Extraocular movements intact.     Conjunctiva/sclera: Conjunctivae normal.     Pupils: Pupils are equal, round, and reactive to light.  Cardiovascular:     Rate and Rhythm: Normal rate and regular rhythm.     Heart sounds: Normal heart sounds, S1 normal and S2 normal. No murmur heard. Pulmonary:     Effort: Pulmonary effort is normal.     Breath sounds: Normal breath sounds. No wheezing, rhonchi or rales.     Comments: Clear to auscultation bilaterally Musculoskeletal:     Cervical back: Normal range of motion and neck supple.     Comments: Strength 5/5 bilateral upper and lower extremities  Neurological:     General: No focal deficit present.     Cranial Nerves: Cranial nerves 2-12 are intact.     Motor: Motor function is intact. No weakness.     Coordination: Coordination is intact. Romberg sign negative. Rapid alternating movements normal.     Gait: Gait is intact.     Comments: Cranial nerves II through XII grossly intact  Psychiatric:        Behavior: Behavior is cooperative.      UC Treatments / Results  Labs (all labs ordered are listed, but  only abnormal results are displayed) Labs Reviewed - No data to display  EKG   Radiology No results found.  Procedures Procedures (including critical care time)  Medications Ordered in UC Medications - No data to display  Initial Impression / Assessment and Plan / UC Course  I have reviewed the triage vital signs and the nursing notes.  Pertinent labs & imaging results that were available during my care of the patient were reviewed by me and considered in my medical decision making (see chart for details).     Patient is well-appearing, afebrile, nontoxic, nontachycardic, with normal neurological exam, however, given she reports that the headache woke her up out of sleep and is the worst she has ever experienced I did recommend she go to the emergency room since we do not have advanced imaging evaluation urgent care.  She is agreeable and will go directly to [facility] ER across our parking lot for further evaluation and management.  She was stable at time of discharge and safe for private transport.  Final Clinical Impressions(s) / UC Diagnoses   Final diagnoses:  Worst headache of life  Severe headache     Discharge Instructions      Since you do not have a history of migraines, this is the worst that you have ever had, and it woke you up out of your sleep I recommend going to the emergency room for further evaluation and imaging.     ED Prescriptions   None  PDMP not reviewed this encounter.   Sherrell Rocky POUR, PA-C 04/21/24 [id number]

## 2024-04-21 NOTE — ED Notes (Signed)
 Patient is being discharged from the Urgent Care and sent to the Emergency Department via private vehicle . Per provider, patient is in need of higher level of care due to headache. Patient is aware and verbalizes understanding of plan of care.  Vitals:   04/21/24 0916  BP: (!) 152/96  Pulse: 90  Resp: 16  Temp: 98.6 F (37 C)  SpO2: 98%

## 2024-04-21 NOTE — Discharge Instructions (Signed)
 Since you do not have a history of migraines, this is the worst that you have ever had, and it woke you up out of your sleep I recommend going to the emergency room for further evaluation and imaging.

## 2024-04-21 NOTE — ED Triage Notes (Signed)
 Quick triage note- Pt sent from UC for head CT due to headache that woke her up around 3 am. Denies history of headaches.

## 2024-04-21 NOTE — ED Notes (Signed)
 Pt. Called in lobby for repeat vitals w/ no response. Moved OTF

## 2024-04-21 NOTE — ED Triage Notes (Signed)
 Patient here today with c/o headache, nausea, and sensitivity to light that started this morning. Patient has tried taking Tylenol  with no relief.

## 2024-05-12 ENCOUNTER — Other Ambulatory Visit: Payer: Self-pay | Admitting: Medical Genetics

## 2024-05-14 ENCOUNTER — Ambulatory Visit: Admitting: Family

## 2024-05-17 ENCOUNTER — Other Ambulatory Visit: Payer: Self-pay

## 2024-05-17 ENCOUNTER — Ambulatory Visit: Admitting: Family Medicine

## 2024-05-17 ENCOUNTER — Other Ambulatory Visit (HOSPITAL_COMMUNITY)
Admission: RE | Admit: 2024-05-17 | Discharge: 2024-05-17 | Disposition: A | Discharge status: Home / Self Care | Source: Ambulatory Visit | Attending: Family Medicine | Admitting: Family Medicine

## 2024-05-17 ENCOUNTER — Encounter: Payer: Self-pay | Admitting: Family Medicine

## 2024-05-17 VITALS — BP 131/88 | HR 97 | Wt 174.0 lb

## 2024-05-17 DIAGNOSIS — R87612 Low grade squamous intraepithelial lesion on cytologic smear of cervix (LGSIL): Secondary | ICD-10-CM | POA: Diagnosis present

## 2024-05-17 DIAGNOSIS — Z23 Encounter for immunization: Secondary | ICD-10-CM | POA: Diagnosis not present

## 2024-05-17 DIAGNOSIS — N914 Secondary oligomenorrhea: Secondary | ICD-10-CM | POA: Diagnosis not present

## 2024-05-17 DIAGNOSIS — N898 Other specified noninflammatory disorders of vagina: Secondary | ICD-10-CM | POA: Insufficient documentation

## 2024-05-17 DIAGNOSIS — Z720 Tobacco use: Secondary | ICD-10-CM

## 2024-05-17 NOTE — Assessment & Plan Note (Addendum)
Smoking and tobacco cessation was discussed at today's visit for 5 minutes

## 2024-05-17 NOTE — Assessment & Plan Note (Signed)
Repeat pap smear today °

## 2024-05-17 NOTE — Patient Instructions (Addendum)
 Preventive Care 81-25 Years Old, Female Preventive care refers to lifestyle choices and visits with your health care provider that can promote health and wellness. Preventive care visits are also called wellness exams. What can I expect for my preventive care visit? Counseling During your preventive care visit, your health care provider may ask about your: Medical history, including: Past medical problems. Family medical history. Pregnancy history. Current health, including: Menstrual cycle. Method of birth control. Emotional well-being. Home life and relationship well-being. Sexual activity and sexual health. Lifestyle, including: Alcohol, nicotine or tobacco, and drug use. Access to firearms. Diet, exercise, and sleep habits. Work and work Astronomer. Sunscreen use. Safety issues such as seatbelt and bike helmet use. Physical exam Your health care provider may check your: Height and weight. These may be used to calculate your BMI (body mass index). BMI is a measurement that tells if you are at a healthy weight. Waist circumference. This measures the distance around your waistline. This measurement also tells if you are at a healthy weight and may help predict your risk of certain diseases, such as type 2 diabetes and high blood pressure. Heart rate and blood pressure. Body temperature. Skin for abnormal spots. What immunizations do I need?  Vaccines are usually given at various ages, according to a schedule. Your health care provider will recommend vaccines for you based on your age, medical history, and lifestyle or other factors, such as travel or where you work. What tests do I need? Screening Your health care provider may recommend screening tests for certain conditions. This may include: Pelvic exam and Pap test. Lipid and cholesterol levels. Diabetes screening. This is done by checking your blood sugar (glucose) after you have not eaten for a while (fasting). Hepatitis  B test. Hepatitis C test. HIV (human immunodeficiency virus) test. STI (sexually transmitted infection) testing, if you are at risk. BRCA-related cancer screening. This may be done if you have a family history of breast, ovarian, tubal, or peritoneal cancers. Talk with your health care provider about your test results, treatment options, and if necessary, the need for more tests. Follow these instructions at home: Eating and drinking  Eat a healthy diet that includes fresh fruits and vegetables, whole grains, lean protein, and low-fat dairy products. Take vitamin and mineral supplements as recommended by your health care provider. Do not drink alcohol if: Your health care provider tells you not to drink. You are pregnant, may be pregnant, or are planning to become pregnant. If you drink alcohol: Limit how much you have to 0-1 drink a day. Know how much alcohol is in your drink. In the [place] one drink equals one 12 oz bottle of beer (355 mL), one 5 oz glass of wine (148 mL), or one 1 oz glass of hard liquor (44 mL). Lifestyle Brush your teeth every morning and night with fluoride toothpaste. Floss one time each day. Exercise for at least 30 minutes 5 or more days each week. Do not use any products that contain nicotine or tobacco. These products include cigarettes, chewing tobacco, and vaping devices, such as e-cigarettes. If you need help quitting, ask your health care provider. Do not use drugs. If you are sexually active, practice safe sex. Use a condom or other form of protection to prevent STIs. If you do not wish to become pregnant, use a form of birth control. If you plan to become pregnant, see your health care provider for a prepregnancy visit. Find healthy ways to manage stress, such as: Meditation,  yoga, or listening to music. Journaling. Talking to a trusted person. Spending time with friends and family. Minimize exposure to UV radiation to reduce your risk of skin  cancer. Safety Always wear your seat belt while driving or riding in a vehicle. Do not drive: If you have been drinking alcohol. Do not ride with someone who has been drinking. If you have been using any mind-altering substances or drugs. While texting. When you are tired or distracted. Wear a helmet and other protective equipment during sports activities. If you have firearms in your house, make sure you follow all gun safety procedures. Seek help if you have been physically or sexually abused. What's next? Go to your health care provider once a year for an annual wellness visit. Ask your health care provider how often you should have your eyes and teeth checked. Stay up to date on all vaccines. This information is not intended to replace advice given to you by your health care provider. Make sure you discuss any questions you have with your health care provider. Document Revised: 01/24/2021 Document Reviewed: 01/24/2021 Elsevier Patient Education  2024 [employer] Congratulations on wanting to quit smoking! I am so happy to hear this! Here are some suggested steps to help make this process easier: 1. Set a quit date - usually 2-4 wks in the future - gives you time to mentally prepare 2. Enlist the help of someone - it is easier with a cheerleader 3. Think about your triggers - do you like to have one after meals, first thing in the morning, etc. 4. Write down a list of alternatives to lighting up - hopefully not involving consuming more calories     A. Making a quick phone call - bonus, reconnecting with someone you care about     B. Chewing a piece of sugar-free gum     C. Drinking a large glass of water - bonus, you need to sty hydrated while pregnant     D. Taking a 10 minute walk - bonus, getting healthy, burning calories, improves mood 5. Put the list where you can see it and quickly use it when the cravings hit 6. Write a list of why you should quit     A. Your breath will  smell better     B. Your lungs will thank you - they start to improve very quickly     C. Your pocketbook will thank you - actually write down what you spend on cigarettes/wk or month and start saving that money--at a bank or at home in a jar. Do the quick math, and you can probably save enough money in 1 year for a cool vacation or something special you have your eye on.     D. Insert your reasons here 7. Post this list where you can see it if your motivation wanes 8. Consider reducing how many cigarettes you smoke daily. Reduce the number by 1 everyday until the quit date arrives.     Example: You smoke 10/day, then tomorrow, you are only allowed 9/day for a couple of days, then 8/day and so on. By the time you get to your quit date, hopefully, you are down to < 5/day. Once you are down to this number, you are no longer physically addicted, you just have a habit. It is a hard habit to break, but I know you can do this!!! I hope this helps. Let me know if you have other questions.

## 2024-05-17 NOTE — Assessment & Plan Note (Addendum)
 Check labs to r/o adrenal, pituitary, thyroid , ovarian etiologies. Lengthy discussion had with pt. to explain PCOS, diagrams and verbal communication used to show impact on ovaries, endometrium, need for endometrial protection, impact on fertility, medical treatments to achieve necessary endpoints.  Should be on cyclical progestin vs. OC's if not trying to get pregnant.  Should be on Femara or clomid if trying to conceive. Not currently trying to be on birth control. Will write for cyclic progesterone if labs are WNL.

## 2024-05-17 NOTE — Progress Notes (Signed)
 Subjective:     Mallory Mcintyre is a 23 y.o. female and is here for a referral.  The patient reports problems - oligomenorrhea.Sine birth of last baby, has had 2-3 months between cycles and hair growth on her chin.  Has h/o abnormal pap, LGSIL in 2023, and needs f/u fort this. Has vaginal discharge.  The following portions of the patient's history were reviewed and updated as appropriate: allergies, current medications, past family history, past medical history, past social history, past surgical history, and problem list.  Review of Systems Pertinent items noted in HPI and remainder of comprehensive ROS otherwise negative.   Objective:  Chaperone present for exam   BP 131/88   Pulse 97   Wt 174 lb (78.9 kg)   LMP 04/14/2024 (Exact Date)   BMI 28.96 kg/m  General appearance: alert, cooperative, and appears stated age Head: Normocephalic, without obvious abnormality, atraumatic Neck: supple, symmetrical, trachea midline Lungs: normal effort Heart: regular rate and rhythm Abdomen: soft, non-tender; bowel sounds normal; no masses,  no organomegaly Pelvic: cervix normal in appearance, external genitalia normal, and vagina normal without discharge Extremities: extremities normal, atraumatic, no cyanosis or edema Neurologic: Grossly normal    Assessment:    Healthy female exam.      Plan:   Problem List Items Addressed This Visit       Unprioritized   LGSIL on Pap smear of cervix   Repeat pap smear today      Relevant Orders   Cytology - PAP( [place])   Tobacco use   Smoking and tobacco cessation was discussed at today's visit for 5 minutes         Secondary oligomenorrhea   Check labs to r/o adrenal, pituitary, thyroid , ovarian etiologies. Lengthy discussion had with pt. to explain PCOS, diagrams and verbal communication used to show impact on ovaries, endometrium, need for endometrial protection, impact on fertility, medical treatments to achieve necessary endpoints.   Should be on cyclical progestin vs. OC's if not trying to get pregnant.  Should be on Femara or clomid if trying to conceive. Not currently trying to be on birth control. Will write for cyclic progesterone if labs are WNL.       Relevant Orders   TSH   Prolactin   Follicle stimulating hormone   17-Hydroxyprogesterone   DHEA-sulfate   Testosterone,Free and Total   Other Visit Diagnoses       Need for immunization against influenza    -  Primary   Relevant Orders   Flu vaccine trivalent PF, 6mos and older(Flulaval,Afluria,Fluarix,Fluzone) (Completed)     Vaginal discharge       Check wet prep and treat accordingly   Relevant Orders   Cervicovaginal ancillary only( [place])      Return in 1 year (on 05/17/2025).    See After Visit Summary for Counseling Recommendations

## 2024-05-18 LAB — CERVICOVAGINAL ANCILLARY ONLY
Bacterial Vaginitis (gardnerella): NEGATIVE
Candida Glabrata: NEGATIVE
Candida Vaginitis: POSITIVE — AB
Chlamydia: NEGATIVE
Comment: NEGATIVE
Comment: NEGATIVE
Comment: NEGATIVE
Comment: NEGATIVE
Comment: NEGATIVE
Comment: NORMAL
Neisseria Gonorrhea: NEGATIVE
Trichomonas: NEGATIVE

## 2024-05-18 LAB — TESTOSTERONE,FREE AND TOTAL
Testosterone, Free: 1.6 pg/mL (ref 0.0–4.2)
Testosterone: 50 ng/dL (ref 13–71)

## 2024-05-19 ENCOUNTER — Ambulatory Visit: Payer: Self-pay | Admitting: Family Medicine

## 2024-05-19 DIAGNOSIS — N914 Secondary oligomenorrhea: Secondary | ICD-10-CM

## 2024-05-19 LAB — CYTOLOGY - PAP
Comment: NEGATIVE
Diagnosis: UNDETERMINED — AB
High risk HPV: NEGATIVE

## 2024-05-19 MED ORDER — METRONIDAZOLE 500 MG PO TABS: 500.0000 mg | ORAL_TABLET | Freq: Two times a day (BID) | ORAL | 0 refills | Status: AC

## 2024-05-20 LAB — 17-HYDROXYPROGESTERONE: 17-OH Progesterone LCMS: 65 ng/dL

## 2024-05-20 LAB — FOLLICLE STIMULATING HORMONE: FSH: 6.1 m[IU]/mL

## 2024-05-20 LAB — PROLACTIN: Prolactin: 6.3 ng/mL (ref 4.8–33.4)

## 2024-05-20 LAB — DHEA-SULFATE: DHEA-SO4: 250 ug/dL (ref 110.0–431.7)

## 2024-05-20 LAB — TSH: TSH: 0.739 u[IU]/mL (ref 0.450–4.500)

## 2024-05-20 MED ORDER — MEDROXYPROGESTERONE ACETATE 10 MG PO TABS
10.0000 mg | ORAL_TABLET | Freq: Every day | ORAL | 3 refills | Status: AC
Start: 2024-05-20 — End: ?

## 2024-05-28 ENCOUNTER — Ambulatory Visit: Admitting: Family

## 2024-06-04 ENCOUNTER — Encounter: Payer: Self-pay | Admitting: Family

## 2024-06-04 ENCOUNTER — Ambulatory Visit (INDEPENDENT_AMBULATORY_CARE_PROVIDER_SITE_OTHER): Admitting: Family

## 2024-06-04 VITALS — BP 138/82 | HR 83 | Temp 97.9°F | Ht 65.0 in | Wt 177.5 lb

## 2024-06-04 DIAGNOSIS — G8929 Other chronic pain: Secondary | ICD-10-CM | POA: Insufficient documentation

## 2024-06-04 DIAGNOSIS — R195 Other fecal abnormalities: Secondary | ICD-10-CM | POA: Diagnosis not present

## 2024-06-04 DIAGNOSIS — I10 Essential (primary) hypertension: Secondary | ICD-10-CM

## 2024-06-04 DIAGNOSIS — Z981 Arthrodesis status: Secondary | ICD-10-CM

## 2024-06-04 DIAGNOSIS — M545 Low back pain, unspecified: Secondary | ICD-10-CM

## 2024-06-04 DIAGNOSIS — Z8739 Personal history of other diseases of the musculoskeletal system and connective tissue: Secondary | ICD-10-CM

## 2024-06-04 DIAGNOSIS — D500 Iron deficiency anemia secondary to blood loss (chronic): Secondary | ICD-10-CM | POA: Insufficient documentation

## 2024-06-04 MED ORDER — FERROUS SULFATE ER 50 MG PO TBCR: 1.0000 | EXTENDED_RELEASE_TABLET | Freq: Every day | ORAL | 11 refills | Status: AC

## 2024-06-04 MED ORDER — AMLODIPINE BESYLATE 5 MG PO TABS: 5.0000 mg | ORAL_TABLET | Freq: Every day | ORAL | 5 refills | Status: AC

## 2024-06-04 NOTE — Progress Notes (Signed)
 Patient ID: Mallory Mcintyre, female    DOB: 10/04/2000, 23 y.o.   MRN: [id number]  Chief Complaint  Patient presents with   Hypertension    Pt did not take Amlodipine  this morning, usually take at night.    Anemia   Fatigue  Discussed the use of AI scribe software for clinical note transcription with the patient, who gave verbal consent to proceed.  History of Present Illness Mallory Mcintyre is a 23 year old female with anemia who presents for follow up for HTN and concerns about irregular periods and fatigue.  She experiences irregular menstrual cycles and fatigue despite dietary changes, including reduced carbohydrate intake and increased protein and vegetables. She has lost approximately six pounds. A recent gynecological evaluation ruled out PCOS.  Anemia is present with a hemoglobin level of 9.3 g/dL a month ago. She was prescribed extended-release iron supplements but experiences stomach discomfort, leading to irregular intake. Stools are dark and oily, occurring more than once a day, primarily in the morning, with associated stomach pain that resolves after bowel movements. No blood is present in the stool. Persistent lower back pain has been ongoing for a couple of weeks. 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 Previous consultations with orthopedics resulted in a prescription for muscle relaxers, which she rarely takes due to sedative effects. She has not engaged in physical therapy. Current medications include amlodipine  for blood pressure management and medroxyprogesterone  for heavy menstrual cycles.  Assessment & Plan Iron deficiency anemia Iron deficiency anemia with hemoglobin at 9.3 g/dL causing fatigue. Current iron supplementation causing gastrointestinal discomfort and nausea. Dark stools noted as normal side effect. Taking progesterone during cycles to decrease blood loss. - Prescribe lower dose of extended-release iron supplement, 50mg   daily. - Advise taking vitamin C with iron to enhance absorption. 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 Previous muscle relaxers ineffective. No lumbar support used while driving. Physical therapy recommended for pain management and core strengthening. - Refer to physical therapy for back pain management and exercises. - Advise on lumbar support cushion for driving if permitted. - Recommend stretching exercises during breaks. - Call back if sx are still no improving  Essential hypertension Blood pressure well-controlled on Amlodipine  5mg  qhs. - Continue amlodipine  at current dose, taken at night, sending refill. - F/U in 3 mos  Loose stools, dietary counseling and weight management Recent dietary changes led to weight loss. Experiencing gastrointestinal changes possibly due to diet. Encouraged exercise for energy improvement. - Continue current dietary plan with focus on protein and vegetables. - Increase soluble fiber in diet to bulk up stools - Continue to hydrate with at least 2 Liters caffeine  free drinks daily. - Encourage regular exercise to improve energy levels. - Monitor gastrointestinal symptoms and report if persistent.  Subjective:    Outpatient Medications Prior to Visit  Medication Sig Dispense Refill   amLODipine  (NORVASC ) 5 MG tablet Take 1 tablet (5 mg total) by mouth daily. 30 tablet 5   ferrous sulfate  325 (65 FE) MG EC tablet Take 1 tablet (325 mg total) by mouth daily with breakfast. 30 tablet 5   methocarbamol  (ROBAXIN ) 500 MG tablet Take 1 tablet (500 mg total) by mouth every 8 (eight) hours as needed for muscle spasms. 30 tablet 0   clotrimazole  (CLOTRIMAZOLE  3) 2 % vaginal cream Place 1 Applicatorful vaginally at  bedtime. (Patient not taking: Reported on 05/17/2024) 21 g 0   medroxyPROGESTERone  (PROVERA ) 10 MG tablet Take 1 tablet (10 mg total) by mouth daily. 30  tablet 3   Semaglutide -Weight Management (WEGOVY ) 0.25 MG/0.5ML SOAJ Inject 0.25 mg into the skin once a week. (Patient not taking: Reported on 05/17/2024) 2 mL 1   No facility-administered medications prior to visit.   Past Medical History:  Diagnosis Date   Bilateral leg edema 04/29/2023   Encounter for supervision of normal pregnancy, antepartum 10/20/2018    Nursing Staff Provider Office Location  [facility] Dating   LMP Language   English  Anatomy US    normal female  Flu Vaccine   Declined 10/20/2018 Genetic Screen  NIPS: low risk  AFP:  Negative   TDaP vaccine  Declined 04-26-19 Hgb A1C or  GTT   Third trimester 1hr GTT 79 Rhogam     LAB RESULTS  Feeding Plan  Breast Blood Type B/Positive/-- (03/10 1535)  Contraception  Undecided 04-26-19 Antibody Negati   History of preterm delivery 08/16/2022   Hx spontaneous PTD at 32 weeks   Scoliosis    Short interval between pregnancies affecting pregnancy, antepartum 11/27/2022   Delivered 08/2022     Past Surgical History:  Procedure Laterality Date   SPINAL FUSION  08/12/2013   No Known Allergies    Objective:    Physical Exam Vitals and nursing note reviewed.  Constitutional:      Appearance: Normal appearance.  Cardiovascular:     Rate and Rhythm: Normal rate and regular rhythm.  Pulmonary:     Effort: Pulmonary effort is normal.     Breath sounds: Normal breath sounds.  Musculoskeletal:        General: Normal range of motion.  Skin:    General: Skin is warm and dry.  Neurological:     Mental Status: She is alert.  Psychiatric:        Mood and Affect: Mood normal.        Behavior: Behavior normal.    BP 138/82 (BP Location: Left Arm, Patient Position: Sitting, Cuff Size: Normal)   Pulse 83   Temp 97.9 F (36.6 C) (Temporal)   Ht 5' 5 (1.651 m)   Wt 177 lb 8 oz (80.5 kg)   LMP  (LMP Unknown)   SpO2 98%   Breastfeeding No   BMI 29.54 kg/m  Wt Readings from Last 3 Encounters:  06/04/24 177 lb 8 oz (80.5 kg)  05/17/24  174 lb (78.9 kg)  04/21/24 180 lb (81.6 kg)      Lucius Krabbe, NP

## 2024-06-14 ENCOUNTER — Encounter: Payer: Self-pay | Admitting: Radiology

## 2024-06-14 ENCOUNTER — Encounter: Payer: Self-pay | Admitting: Family

## 2024-06-21 ENCOUNTER — Other Ambulatory Visit: Payer: Self-pay

## 2024-06-21 NOTE — Telephone Encounter (Signed)
 FMLA forms given to Corean Comment, NP

## 2024-06-26 ENCOUNTER — Ambulatory Visit

## 2024-07-10 ENCOUNTER — Encounter: Payer: Self-pay | Admitting: *Deleted

## 2024-07-10 ENCOUNTER — Ambulatory Visit: Admission: EM | Admit: 2024-07-10 | Discharge: 2024-07-10 | Disposition: A | Discharge status: Home / Self Care

## 2024-07-10 DIAGNOSIS — J209 Acute bronchitis, unspecified: Secondary | ICD-10-CM | POA: Diagnosis not present

## 2024-07-10 MED ORDER — GUAIFENESIN ER 600 MG PO TB12: 600.0000 mg | ORAL_TABLET | Freq: Two times a day (BID) | ORAL | 0 refills | Status: AC

## 2024-07-10 MED ORDER — PREDNISONE 50 MG PO TABS: ORAL_TABLET | ORAL | 0 refills | Status: AC

## 2024-07-10 MED ORDER — BENZONATATE 100 MG PO CAPS: 100.0000 mg | ORAL_CAPSULE | Freq: Three times a day (TID) | ORAL | 0 refills | Status: AC

## 2024-07-10 NOTE — ED Provider Notes (Signed)
 [facility]    CSN: [id number] Arrival date & time: 07/10/24  9160      History   Chief Complaint Chief Complaint  Patient presents with   Cough    HPI Mallory Mcintyre is a 23 y.o. female.   Pt with a history of tobacco use presents today due to cough that is occasionally productive since Tuesday.  Patient states that she has been trying over-the-counter cold medications without significant relief.  Patient states the cough is worse when lying flat.  Patient denies known sick contacts.  Patient denies fever or chills.  Patient states that she is eating and drinking less than normal.  Patient denies getting something similar to this every year, patient states that she rarely gets sick.  The history is provided by the patient.  Cough   Past Medical History:  Diagnosis Date   Bilateral leg edema 04/29/2023   Encounter for supervision of normal pregnancy, antepartum 10/20/2018    Nursing Staff Provider Office Location  [facility] Dating   LMP Language   English  Anatomy US    normal female  Flu Vaccine   Declined 10/20/2018 Genetic Screen  NIPS: low risk  AFP:  Negative   TDaP vaccine  Declined 04-26-19 Hgb A1C or  GTT   Third trimester 1hr GTT 79 Rhogam     LAB RESULTS  Feeding Plan  Breast Blood Type B/Positive/-- (03/10 1535)  Contraception  Undecided 04-26-19 Antibody Negati   History of preterm delivery 08/16/2022   Hx spontaneous PTD at 32 weeks   Scoliosis    Short interval between pregnancies affecting pregnancy, antepartum 11/27/2022   Delivered 08/2022      Patient Active Problem List   Diagnosis Date Noted   Chronic bilateral low back pain without sciatica 06/04/2024   Iron deficiency anemia due to chronic blood loss 06/04/2024   Tobacco use 05/17/2024   Secondary oligomenorrhea 05/17/2024   Obesity (BMI 30-39.9) 12/26/2023   Essential (primary) hypertension 04/29/2023   History of gestational diabetes 08/13/2022   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    History of spinal fusion for scoliosis 09/05/2021   Multiple joint pain 09/05/2021   Alpha thalassemia silent carrier 11/03/2018    Past Surgical History:  Procedure Laterality Date   SPINAL FUSION  08/12/2013    OB History     Gravida  3   Para  2   Term  1   Preterm  1   AB  1   Living  2      SAB  1   IAB  0   Ectopic  0   Multiple  0   Live Births  2        Obstetric Comments  LMP: 03/14/2021          Home Medications    Prior to Admission medications   Medication Sig Start Date End Date Taking? Authorizing Provider  amLODipine  (NORVASC ) 5 MG tablet Take 1 tablet (5 mg total) by mouth at bedtime. 06/04/24  Yes Lucius Krabbe, NP  benzonatate  (TESSALON ) 100 MG capsule Take 1 capsule (100 mg total) by mouth every 8 (eight) hours. 07/10/24  Yes Andra Krabbe C, PA-C  Ferrous Sulfate  (SLOW RELEASE IRON) 50 MG TBCR Take 1 tablet by mouth daily. 06/04/24  Yes Hudnell, Krabbe, NP  guaiFENesin  (MUCINEX ) 600 MG 12 hr tablet Take 1 tablet (600 mg total) by mouth 2 (two) times daily for 10 days. 07/10/24 07/20/24 Yes Andra Krabbe BROCKS, PA-C  medroxyPROGESTERone  (PROVERA ) 10 MG tablet Take 1 tablet (10 mg total) by mouth daily. 05/20/24  Yes Fredirick Glenys RAMAN, MD  predniSONE  (DELTASONE ) 50 MG tablet Take 1 tab po daily for 5 days 07/10/24  Yes Andra Krabbe C, PA-C  clotrimazole  (CLOTRIMAZOLE  3) 2 % vaginal cream Place 1 Applicatorful vaginally at bedtime. Patient not taking: Reported on 05/17/2024 12/30/23   Lucius Krabbe, NP  methocarbamol  (ROBAXIN ) 500 MG tablet Take 1 tablet (500 mg total) by mouth every 8 (eight) hours as needed for muscle spasms. Patient not taking: Reported on 07/10/2024 01/06/24   Harden Jerona GAILS, MD    Family History Family History  Problem Relation Age of Onset   Heart disease Maternal Grandmother    Drug abuse Maternal Grandfather    COPD Maternal Grandfather    Alcohol abuse Maternal Grandfather    Liver  cancer Maternal Grandfather    Diabetes Other    Asthma Other    Hypertension Other     Social History Social History   Tobacco Use   Smoking status: Every Day    Types: Cigars   Smokeless tobacco: Never   Tobacco comments:    2-3 Black n mild daily   Vaping Use   Vaping status: Never Used  Substance Use Topics   Alcohol use: Not Currently   Drug use: Not Currently    Types: Marijuana    Comment: last smoked June 2020     Allergies   Patient has no known allergies.   Review of Systems Review of Systems  Respiratory:  Positive for cough.      Physical Exam Triage Vital Signs ED Triage Vitals  Encounter Vitals Group     BP 07/10/24 0932 (!) 130/90     Girls Systolic BP Percentile --      Girls Diastolic BP Percentile --      Boys Systolic BP Percentile --      Boys Diastolic BP Percentile --      Pulse Rate 07/10/24 0932 98     Resp 07/10/24 0932 18     Temp 07/10/24 0932 98.5 F (36.9 C)     Temp Source 07/10/24 0932 Oral     SpO2 07/10/24 0932 98 %     Weight --      Height --      Head Circumference --      Peak Flow --      Pain Score 07/10/24 0930 5     Pain Loc --      Pain Education --      Exclude from Growth Chart --    No data found.  Updated Vital Signs BP (!) 130/90 (BP Location: Left Arm)   Pulse 98   Temp 98.5 F (36.9 C) (Oral)   Resp 18   LMP 07/03/2024 (Approximate)   SpO2 98%   Breastfeeding No   Visual Acuity Right Eye Distance:   Left Eye Distance:   Bilateral Distance:    Right Eye Near:   Left Eye Near:    Bilateral Near:     Physical Exam Vitals and nursing note reviewed.  Constitutional:      General: She is not in acute distress.    Appearance: Normal appearance. She is not ill-appearing, toxic-appearing or diaphoretic.  HENT:     Nose: Congestion (moderately enlarged turbinates) present. No rhinorrhea.     Mouth/Throat:     Mouth: Mucous membranes are moist.     Pharynx: Oropharynx is clear. No  oropharyngeal exudate or posterior oropharyngeal erythema.  Eyes:     General: No scleral icterus. Cardiovascular:     Rate and Rhythm: Normal rate and regular rhythm.     Heart sounds: Normal heart sounds.  Pulmonary:     Effort: Pulmonary effort is normal. No respiratory distress.     Breath sounds: Normal breath sounds. No wheezing or rhonchi.  Skin:    General: Skin is warm.  Neurological:     Mental Status: She is alert and oriented to person, place, and time.  Psychiatric:        Mood and Affect: Mood normal.        Behavior: Behavior normal.      UC Treatments / Results  Labs (all labs ordered are listed, but only abnormal results are displayed) Labs Reviewed - No data to display  EKG   Radiology No results found.  Procedures Procedures (including critical care time)  Medications Ordered in UC Medications - No data to display  Initial Impression / Assessment and Plan / UC Course  I have reviewed the triage vital signs and the nursing notes.  Pertinent labs & imaging results that were available during my care of the patient were reviewed by me and considered in my medical decision making (see chart for details).    Final Clinical Impressions(s) / UC Diagnoses   Final diagnoses:  Acute bronchitis, unspecified organism   Discharge Instructions   None    ED Prescriptions     Medication Sig Dispense Auth. Provider   benzonatate (TESSALON) 100 MG capsule Take 1 capsule (100 mg total) by mouth every 8 (eight) hours. 30 capsule Andra Krabbe C, PA-C   guaiFENesin (MUCINEX) 600 MG 12 hr tablet Take 1 tablet (600 mg total) by mouth 2 (two) times daily for 10 days. 20 tablet Andra Krabbe C, PA-C   predniSONE (DELTASONE) 50 MG tablet Take 1 tab po daily for 5 days 5 tablet Andra Krabbe BROCKS, PA-C      PDMP not reviewed this encounter.   Andra Krabbe BROCKS, PA-C 07/10/24 1005

## 2024-07-10 NOTE — ED Triage Notes (Signed)
 Pt reports: Cough since Tuesday. Feels hot cold at times. Ribs are sore. Chest hurts with cough. Sometimes feels SOB when coughing or lying down.  Taking OTC cold and flu medication with no relief- last dose this morning.

## 2024-07-28 NOTE — Therapy (Incomplete)
 OUTPATIENT PHYSICAL THERAPY THORACOLUMBAR EVALUATION   Patient Name: Mallory Mcintyre MRN: [id number] DOB:04/13/2001, 23 y.o., female Today's Date: 07/28/2024  END OF SESSION:   Past Medical History:  Diagnosis Date   Bilateral leg edema 04/29/2023   Encounter for supervision of normal pregnancy, antepartum 10/20/2018    Nursing Staff Provider Office Location  [facility] Dating   LMP Language   English  Anatomy US    normal female  Flu Vaccine   Declined 10/20/2018 Genetic Screen  NIPS: low risk  AFP:  Negative   TDaP vaccine  Declined 04-26-19 Hgb A1C or  GTT   Third trimester 1hr GTT 79 Rhogam     LAB RESULTS  Feeding Plan  Breast Blood Type B/Positive/-- (03/10 1535)  Contraception  Undecided 04-26-19 Antibody Negati   History of preterm delivery 08/16/2022   Hx spontaneous PTD at 32 weeks   Scoliosis    Short interval between pregnancies affecting pregnancy, antepartum 11/27/2022   Delivered 08/2022     Past Surgical History:  Procedure Laterality Date   SPINAL FUSION  08/12/2013   Patient Active Problem List   Diagnosis Date Noted   Chronic bilateral low back pain without sciatica 06/04/2024   Iron deficiency anemia due to chronic blood loss 06/04/2024   Tobacco use 05/17/2024   Secondary oligomenorrhea 05/17/2024   Obesity (BMI 30-39.9) 12/26/2023   Essential (primary) hypertension 04/29/2023   History of gestational diabetes 08/13/2022   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   History of spinal fusion for scoliosis 09/05/2021   Multiple joint pain 09/05/2021   Alpha thalassemia silent carrier 11/03/2018    PCP: Lucius Krabbe, NP   REFERRING PROVIDER: Lucius Krabbe, NP   REFERRING DIAG:  M54.50,G89.29 (ICD-10-CM) - Chronic bilateral low back pain without sciatica  Z98.1,Z87.39 (ICD-10-CM) - History of spinal fusion for scoliosis   Rationale for Evaluation and Treatment: Rehabilitation  THERAPY DIAG:  No diagnosis found.  ONSET DATE: ***  SUBJECTIVE:                                                                                                                                                                                            SUBJECTIVE STATEMENT: ***  PERTINENT HISTORY:  ***  PAIN:  Are you having pain? Yes: NPRS scale: *** Pain location: *** Pain description: *** Aggravating factors: *** Relieving factors: ***  PRECAUTIONS: {Therapy precautions:24002}  RED FLAGS: {PT Red Flags:29287}   WEIGHT BEARING RESTRICTIONS: {Yes ***/No:24003}  FALLS:  Has patient fallen in last 6 months? {fallsyesno:27318}  LIVING ENVIRONMENT: Lives with: {[facility] lives with:25569::lives with their family} Lives in: {Lives in:25570} Stairs: {opstairs:27293} Has following  equipment at home: {Assistive devices:23999}  OCCUPATION: ***  PLOF: {PLOF:24004}  PATIENT GOALS: ***  NEXT MD VISIT: ***  OBJECTIVE:  Note: Objective measures were completed at Evaluation unless otherwise noted.  DIAGNOSTIC FINDINGS:  2 view radiographs of the cervical spine shows straightening of the  cervical lordosis.  No degenerative changes.   2 view radiographs of the thoracic spine shows stable internal fixation  for scoliosis without hardware complications.   2 view radiographs of the lumbar spine shows stable internal fixation.   There is no hardware loosening or failure.  No degenerative changes of the  adjacent segments.   PATIENT SURVEYS:  {rehab surveys:24030}  COGNITION: Overall cognitive status: {cognition:24006}     SENSATION: {sensation:27233}  MUSCLE LENGTH: Hamstrings: Right *** deg; Left *** deg Debby test: Right *** deg; Left *** deg  POSTURE: {posture:25561}  PALPATION: ***  LUMBAR ROM:   AROM eval  Flexion   Extension   Right lateral flexion   Left lateral flexion   Right rotation   Left rotation    (Blank rows = not tested)  LOWER EXTREMITY ROM:     {AROM/PROM:27142}  Right eval Left eval  Hip flexion    Hip  extension    Hip abduction    Hip adduction    Hip internal rotation    Hip external rotation    Knee flexion    Knee extension    Ankle dorsiflexion    Ankle plantarflexion    Ankle inversion    Ankle eversion     (Blank rows = not tested)  LOWER EXTREMITY MMT:    MMT Right eval Left eval  Hip flexion    Hip extension    Hip abduction    Hip adduction    Hip internal rotation    Hip external rotation    Knee flexion    Knee extension    Ankle dorsiflexion    Ankle plantarflexion    Ankle inversion    Ankle eversion     (Blank rows = not tested)  LUMBAR SPECIAL TESTS:  {lumbar special test:25242}  FUNCTIONAL TESTS:  {Functional tests:24029}  GAIT: Distance walked: *** Assistive device utilized: {Assistive devices:23999} Level of assistance: {Levels of assistance:24026} Comments: ***  TREATMENT DATE:  [facility] Adult PT Treatment:                                                DATE: 07/30/24 Therapeutic Exercise: *** Manual Therapy: *** Neuromuscular re-ed: *** Therapeutic Activity: *** Modalities: *** Self Care: ***                                                                                                                                 PATIENT EDUCATION:  Education details: *** Person educated: {Person educated:25204} Education method: {Education Method:25205} Education  comprehension: {Education Comprehension:25206}  HOME EXERCISE PROGRAM: ***  ASSESSMENT:  CLINICAL IMPRESSION: Patient is a 23 y.o. female who was seen today for physical therapy evaluation and treatment for  M54.50,G89.29 (ICD-10-CM) - Chronic bilateral low back pain without sciatica  Z98.1,Z87.39 (ICD-10-CM) - History of spinal fusion for scoliosis  .   OBJECTIVE IMPAIRMENTS: {opptimpairments:25111}.   ACTIVITY LIMITATIONS: {activitylimitations:27494}  PARTICIPATION LIMITATIONS: {participationrestrictions:25113}  PERSONAL FACTORS: {Personal factors:25162} are also  affecting patient's functional outcome.   REHAB POTENTIAL: {rehabpotential:25112}  CLINICAL DECISION MAKING: {clinical decision making:25114}  EVALUATION COMPLEXITY: {Evaluation complexity:25115}   GOALS:  SHORT TERM GOALS: Target date: 08/13/24  Pt will be Ind in an initial HEP  Baseline:started Goal status: INITIAL  2.  *** Baseline:  Goal status: INITIAL  3.  *** Baseline:  Goal status: INITIAL  4.  *** Baseline:  Goal status: INITIAL  5.  *** Baseline:  Goal status: INITIAL  6.  *** Baseline:  Goal status: INITIAL  LONG TERM GOALS: Target date: ***  Pt will be Ind in a final HEP to maintain achieved LOF  Baseline: started Goal status: INITIAL  2.  *** Baseline:  Goal status: INITIAL  3.  *** Baseline:  Goal status: INITIAL  4.  *** Baseline:  Goal status: INITIAL  5.  *** Baseline:  Goal status: INITIAL  6.  *** Baseline:  Goal status: INITIAL  PLAN:  PT FREQUENCY: {rehab frequency:25116}  PT DURATION: {rehab duration:25117}  PLANNED INTERVENTIONS: {rehab planned interventions:25118::97110-Therapeutic exercises,97530- Therapeutic [phone]- Neuromuscular re-education,97535- Self Rjmz,02859- Manual therapy,Patient/Family education}.  PLAN FOR NEXT SESSION: ***   Dasie Daft, PT 07/28/2024, 5:48 PM

## 2024-07-30 ENCOUNTER — Ambulatory Visit

## 2024-09-10 ENCOUNTER — Ambulatory Visit: Admitting: Family
